# Patient Record
Sex: Female | Born: 1955 | Race: White | Hispanic: No | Marital: Married | Smoking: Former smoker
Health system: Southern US, Community
[De-identification: ages and names within clinical notes are randomized; demographics above are authoritative.]

## PROBLEM LIST (undated history)

## (undated) DIAGNOSIS — K219 Gastro-esophageal reflux disease without esophagitis: Secondary | ICD-10-CM

## (undated) DIAGNOSIS — E785 Hyperlipidemia, unspecified: Secondary | ICD-10-CM

## (undated) DIAGNOSIS — I10 Essential (primary) hypertension: Secondary | ICD-10-CM

## (undated) DIAGNOSIS — M1990 Unspecified osteoarthritis, unspecified site: Secondary | ICD-10-CM

## (undated) DIAGNOSIS — E039 Hypothyroidism, unspecified: Secondary | ICD-10-CM

## (undated) DIAGNOSIS — E119 Type 2 diabetes mellitus without complications: Secondary | ICD-10-CM

## (undated) DIAGNOSIS — I2510 Atherosclerotic heart disease of native coronary artery without angina pectoris: Secondary | ICD-10-CM

## (undated) HISTORY — DX: Essential (primary) hypertension: I10

## (undated) HISTORY — DX: Type 2 diabetes mellitus without complications: E11.9

## (undated) HISTORY — DX: Atherosclerotic heart disease of native coronary artery without angina pectoris: I25.10

## (undated) HISTORY — PX: APPENDECTOMY: SHX54

## (undated) HISTORY — DX: Hypothyroidism, unspecified: E03.9

## (undated) HISTORY — DX: Hyperlipidemia, unspecified: E78.5

## (undated) HISTORY — PX: ORIF ANKLE FRACTURE: SUR919

---

## 1980-11-02 HISTORY — PX: CHOLECYSTECTOMY: SHX55

## 1982-11-02 HISTORY — PX: TUBAL LIGATION: SHX77

## 2000-08-02 HISTORY — PX: CORONARY ANGIOPLASTY WITH STENT PLACEMENT: SHX49

## 2000-12-14 HISTORY — PX: CORONARY ARTERY BYPASS GRAFT: SHX141

## 2008-11-02 HISTORY — PX: FEMORAL ARTERY REPAIR: SHX1582

## 2009-06-02 HISTORY — PX: CORONARY ANGIOPLASTY WITH STENT PLACEMENT: SHX49

## 2013-08-02 DIAGNOSIS — E119 Type 2 diabetes mellitus without complications: Secondary | ICD-10-CM

## 2013-08-02 HISTORY — DX: Type 2 diabetes mellitus without complications: E11.9

## 2013-09-08 ENCOUNTER — Encounter: Payer: Self-pay | Admitting: Internal Medicine

## 2013-09-08 ENCOUNTER — Ambulatory Visit (INDEPENDENT_AMBULATORY_CARE_PROVIDER_SITE_OTHER): Payer: Managed Care, Other (non HMO) | Admitting: Internal Medicine

## 2013-09-08 VITALS — BP 110/80 | HR 69 | Ht 60.0 in | Wt 159.0 lb

## 2013-09-08 DIAGNOSIS — Z951 Presence of aortocoronary bypass graft: Secondary | ICD-10-CM

## 2013-09-08 DIAGNOSIS — I10 Essential (primary) hypertension: Secondary | ICD-10-CM

## 2013-09-08 DIAGNOSIS — I2510 Atherosclerotic heart disease of native coronary artery without angina pectoris: Secondary | ICD-10-CM

## 2013-09-08 DIAGNOSIS — E119 Type 2 diabetes mellitus without complications: Secondary | ICD-10-CM | POA: Insufficient documentation

## 2013-09-08 DIAGNOSIS — E785 Hyperlipidemia, unspecified: Secondary | ICD-10-CM | POA: Insufficient documentation

## 2013-09-08 DIAGNOSIS — E039 Hypothyroidism, unspecified: Secondary | ICD-10-CM

## 2013-09-08 MED ORDER — NITROGLYCERIN 0.4 MG SL SUBL: 0.4000 mg | SUBLINGUAL_TABLET | SUBLINGUAL | Status: DC | PRN

## 2013-09-08 NOTE — Progress Notes (Signed)
OFFICE NOTE  Chief Complaint:  Establish with a cardiologist  Primary Care Physician: Pcp Not In System  HPI:  Cheryl Dunn is a pleasant 57 year old female who is from [place], [place], who unfortunately has a history of coronary artery disease and underwent a stent to the proximal LAD in 2001. Apparently this was difficult and the result was somewhat suboptimal. Less than one year later she was having chest pain while in cardiac rehabilitation and underwent a repeat cardiac catheterization. This demonstrated a significant bifurcation lesion at the ostial LAD and left circumflex. Based on these findings a coronary bypass was recommended. She'll LAF underwent two-vessel coronary artery bypass grafting in 2002 with a LIMA to the LAD and radial artery to the circumflex.  She has done well since that time.  Cheryl Dunn has dyslipidemia, mild hypothyroidism and mild hypertension-well controlled.  And she doesn't and she unfortunately had a complication with cardiac catheterization and had to have a patch repair of the right femoral artery. Currently she is asymptomatic and is interesting in starting care with a cardiologist in the area.  PMHx:  Past Medical History  Diagnosis Date  . Diabetes 08/2013  . Hyperlipidemia   . Hypertension   . Hypothyroidism   . CAD (coronary artery disease)     s/p CABG & caths with stenting     Past Surgical History  Procedure Laterality Date  . Cholecystectomy  1982  . Appendectomy    . Tubal ligation  1984  . Coronary artery bypass graft  12/14/2000  . Coronary angioplasty with stent placement  08/2000    stent  . Coronary angioplasty with stent placement  06/2009    stent  . Femoral artery repair  2010    post-cath    FAMHx:  Family History  Problem Relation Age of Onset  . Hypertension Mother   . Diabetes Mother   . Hypertension Father   . Diabetes Father   . Heart attack Maternal Grandfather   . Hypertension Paternal Grandmother   . Liver disease  Paternal Grandfather   . Hyperlipidemia Sister     SOCHx:   reports that she has been smoking.  She has never used smokeless tobacco. She reports that she does not drink alcohol or use illicit drugs.  ALLERGIES:  Allergies  Allergen Reactions  . Codeine Diarrhea and Nausea Only  . Vicodin [Hydrocodone-Acetaminophen] Hives    ROS: A comprehensive review of systems was negative.  HOME MEDS: Current Outpatient Prescriptions  Medication Sig Dispense Refill  . clopidogrel (PLAVIX) 75 MG tablet Take 75 mg by mouth daily with breakfast.      . FLUTICASONE PROPIONATE, NASAL, NA Place into the nose daily as needed.      Marland Kitchen glipiZIDE (GLUCOTROL) 5 MG tablet Take 5 mg by mouth daily before breakfast.      . hydrochlorothiazide (HYDRODIURIL) 25 MG tablet Take 25 mg by mouth daily.      . isosorbide mononitrate (IMDUR) 30 MG 24 hr tablet Take 30 mg by mouth daily.      Marland Kitchen levothyroxine (SYNTHROID, LEVOTHROID) 150 MCG tablet Take 150 mcg by mouth daily before breakfast.      . metoprolol tartrate (LOPRESSOR) 25 MG tablet Take 25 mg by mouth 2 (two) times daily.      . nitroGLYCERIN (NITROSTAT) 0.4 MG SL tablet Place 1 tablet (0.4 mg total) under the tongue every 5 (five) minutes as needed for chest pain.  25 tablet  3  . OVER THE COUNTER  MEDICATION daily as needed (sleeping aid). Midnight - contains melatonin      . rosuvastatin (CRESTOR) 10 MG tablet Take 10 mg by mouth daily.       No current facility-administered medications for this visit.    LABS/IMAGING: No results found for this or any previous visit (from the past 48 hour(s)). No results found.  VITALS: Pulse 69  Ht 5' (1.524 m)  Wt 159 lb (72.122 kg)  BMI 31.05 kg/m2  EXAM: General appearance: alert and no distress Neck: no carotid bruit and no JVD Lungs: clear to auscultation bilaterally Heart: regular rate and rhythm, S1, S2 normal, no murmur, click, rub or gallop Abdomen: soft, non-tender; bowel sounds normal; no masses,   no organomegaly Extremities: extremities normal, atraumatic, no cyanosis or edema Pulses: 2+ and symmetric Skin: Skin color, texture, turgor normal. No rashes or lesions Neurologic: Grossly normal Psych: Pleasant, normal affect  EKG: Normal sinus rhythm at 69  ASSESSMENT: 1. Coronary artery disease status post PCI to the LAD in 2001 2. Two-vessel CABG for bifurcation ostial LAD and circumflex disease in 2002 (LIMA to LAD, free radial to circumflex) 3. Dyslipidemia 4. Hypertension 5. Diabetes type 2 6. Hypothyroidism  PLAN: 1.   Ms. Lekas is doing very well now 12 years after her bypass surgery. Firstly she's had 2 arterial conduits which generally have a very long life span. She's also been on good cholesterol medication and his work on diet and exercise. She's had no further cardiac events since that time although underwent cardiac catheterization in 2010 which of course was complicated by injury to the right femoral artery necessitating a repair. I would like to go ahead and recheck a lipid profile as it's been some time since her primary care doctor has done this. She is also establishing with a new primary care provider in [place]. We will go ahead and request records from [place] although she did bring records of her bypass anatomy and a catheterization film image from prior to her bypass.  Plan to see her back in 6 months.  Chrystie Nose, MD, [facility] Attending Cardiologist [facility]  Dory Demont C 09/08/2013, 1:20 PM

## 2013-09-08 NOTE — Patient Instructions (Addendum)
Please have fasting blood work.   Your physician wants you to follow-up in: 6 months You will receive a reminder letter in the mail two months in advance. If you don't receive a letter, please call our office to schedule the follow-up appointment.  We have sent a prescription for nitroglycerin to your pharmacy

## 2013-10-13 ENCOUNTER — Ambulatory Visit: Payer: Managed Care, Other (non HMO) | Admitting: Gastroenterology

## 2013-10-20 LAB — NMR LIPOPROFILE WITH LIPIDS
Cholesterol, Total: 169 mg/dL (ref <200)
HDL Particle Number: 44.6 umol/L (ref >=30.5)
HDL Size: 10 nm (ref >=9.2)
HDL-C: 62 mg/dL (ref >=40)
LDL (calc): 92 mg/dL (ref <100)
LDL Particle Number: 821 nmol/L (ref <1000)
LDL Size: 20.5 nm — ABNORMAL LOW (ref >20.5)
LP-IR Score: 36 (ref <=45)
Large HDL-P: 11.3 umol/L (ref >=4.8)
Large VLDL-P: 3.6 nmol/L — ABNORMAL HIGH (ref <=2.7)
Small LDL Particle Number: 378 nmol/L (ref <=527)
Triglycerides: 75 mg/dL (ref <150)
VLDL Size: 49.8 nm — ABNORMAL HIGH (ref <=46.6)

## 2013-10-31 ENCOUNTER — Encounter: Payer: Self-pay | Admitting: *Deleted

## 2014-08-30 ENCOUNTER — Ambulatory Visit (INDEPENDENT_AMBULATORY_CARE_PROVIDER_SITE_OTHER): Payer: Managed Care, Other (non HMO) | Admitting: Otolaryngology

## 2014-08-30 DIAGNOSIS — H903 Sensorineural hearing loss, bilateral: Secondary | ICD-10-CM

## 2014-08-30 DIAGNOSIS — H6983 Other specified disorders of Eustachian tube, bilateral: Secondary | ICD-10-CM

## 2014-10-02 ENCOUNTER — Telehealth: Payer: Self-pay | Admitting: Internal Medicine

## 2014-10-02 NOTE — Telephone Encounter (Signed)
Close encounter 

## 2014-11-08 ENCOUNTER — Ambulatory Visit (INDEPENDENT_AMBULATORY_CARE_PROVIDER_SITE_OTHER): Payer: Managed Care, Other (non HMO) | Admitting: Internal Medicine

## 2014-11-08 ENCOUNTER — Encounter: Payer: Self-pay | Admitting: Internal Medicine

## 2014-11-08 VITALS — BP 122/78 | HR 68 | Ht 60.0 in | Wt 167.4 lb

## 2014-11-08 DIAGNOSIS — I2510 Atherosclerotic heart disease of native coronary artery without angina pectoris: Secondary | ICD-10-CM

## 2014-11-08 DIAGNOSIS — E785 Hyperlipidemia, unspecified: Secondary | ICD-10-CM

## 2014-11-08 DIAGNOSIS — Z951 Presence of aortocoronary bypass graft: Secondary | ICD-10-CM

## 2014-11-08 DIAGNOSIS — I2583 Coronary atherosclerosis due to lipid rich plaque: Secondary | ICD-10-CM

## 2014-11-08 DIAGNOSIS — E119 Type 2 diabetes mellitus without complications: Secondary | ICD-10-CM

## 2014-11-08 DIAGNOSIS — I10 Essential (primary) hypertension: Secondary | ICD-10-CM

## 2014-11-08 NOTE — Patient Instructions (Signed)
Your physician wants you to follow-up in: 1 Year You will receive a reminder letter in the mail two months in advance. If you don't receive a letter, please call our office to schedule the follow-up appointment.  Your physician recommends that you return for lab work in: Fasting NMR with Lipids

## 2014-11-08 NOTE — Progress Notes (Signed)
OFFICE NOTE  Chief Complaint:  Routine follow-up, occasional chest twinge  Primary Care Physician: Cheryl Pizza, MD  HPI:  Cheryl Dunn is a pleasant 59 year old female who is from [place], [place], who unfortunately has a history of coronary artery disease and underwent a stent to the proximal LAD in 2001. Apparently this was difficult and the result was somewhat suboptimal. Less than one year later she was having chest pain while in cardiac rehabilitation and underwent a repeat cardiac catheterization. This demonstrated a significant bifurcation lesion at the ostial LAD and left circumflex. Based on these findings a coronary bypass was recommended. She'll LAF underwent two-vessel coronary artery bypass grafting in 2002 with a LIMA to the LAD and radial artery to the circumflex.  She has done well since that time.  Cheryl Dunn has dyslipidemia, mild hypothyroidism and mild hypertension-well controlled.  And she doesn't and she unfortunately had a complication with cardiac catheterization and had to have a patch repair of the right femoral artery. Currently she is asymptomatic and is interesting in starting care with a cardiologist in the area.  Cheryl Dunn returns today for follow-up. She feels quite well. She occasionally struggles with some sadness and has been intermittently smoking. She had quit successfully years ago around the time of her bypass surgery, but admits that she has been buying cigarettes and smoking. She has no real anginal symptoms and denies any worsening shortness of breath. She occasionally gets a cough which I feel is related to smoking.  PMHx:  Past Medical History  Diagnosis Date  . Diabetes 08/2013  . Hyperlipidemia   . Hypertension   . Hypothyroidism   . CAD (coronary artery disease)     s/p CABG & caths with stenting     Past Surgical History  Procedure Laterality Date  . Cholecystectomy  1982  . Appendectomy    . Tubal ligation  1984  . Coronary artery bypass graft   12/14/2000  . Coronary angioplasty with stent placement  08/2000    stent  . Coronary angioplasty with stent placement  06/2009    stent  . Femoral artery repair  2010    post-cath    FAMHx:  Family History  Problem Relation Age of Onset  . Hypertension Mother   . Diabetes Mother   . Hypertension Father   . Diabetes Father   . Heart attack Maternal Grandfather   . Hypertension Paternal Grandmother   . Liver disease Paternal Grandfather   . Hyperlipidemia Sister     SOCHx:   reports that she has been smoking.  She has never used smokeless tobacco. She reports that she does not drink alcohol or use illicit drugs.  ALLERGIES:  Allergies  Allergen Reactions  . Codeine Diarrhea and Nausea Only  . Vicodin [Hydrocodone-Acetaminophen] Hives    ROS: A comprehensive review of systems was negative except for: Respiratory: positive for cough  HOME MEDS: Current Outpatient Prescriptions  Medication Sig Dispense Refill  . clopidogrel (PLAVIX) 75 MG tablet Take 75 mg by mouth daily with breakfast.    . FLUTICASONE PROPIONATE, NASAL, NA Place into the nose daily as needed.    Marland Kitchen glipiZIDE (GLUCOTROL) 5 MG tablet Take 5 mg by mouth daily before breakfast.    . hydrochlorothiazide (HYDRODIURIL) 25 MG tablet Take 25 mg by mouth daily.    . isosorbide mononitrate (IMDUR) 30 MG 24 hr tablet Take 30 mg by mouth daily.    Marland Kitchen JANUVIA 100 MG tablet Take 1 tablet by mouth  daily.    . levothyroxine (SYNTHROID, LEVOTHROID) 150 MCG tablet Take 150 mcg by mouth daily before breakfast.    . metoprolol tartrate (LOPRESSOR) 25 MG tablet Take 25 mg by mouth 2 (two) times daily.    . nitroGLYCERIN (NITROSTAT) 0.4 MG SL tablet Place 1 tablet (0.4 mg total) under the tongue every 5 (five) minutes as needed for chest pain. 25 tablet 3  . OVER THE COUNTER MEDICATION daily as needed (sleeping aid). Midnight - contains melatonin    . ranitidine (ZANTAC) 300 MG capsule Take 1 capsule by mouth daily.    .  rosuvastatin (CRESTOR) 10 MG tablet Take 10 mg by mouth daily.     No current facility-administered medications for this visit.    LABS/IMAGING: No results found for this or any previous visit (from the past 48 hour(s)). No results found.  VITALS: BP 122/78 mmHg  Pulse 68  Ht 5' (1.524 m)  Wt 167 lb 6.4 oz (75.932 kg)  BMI 32.69 kg/m2  EXAM: General appearance: alert and no distress Neck: no carotid bruit and no JVD Lungs: clear to auscultation bilaterally Heart: regular rate and rhythm, S1, S2 normal, no murmur, click, rub or gallop Abdomen: soft, non-tender; bowel sounds normal; no masses,  no organomegaly Extremities: extremities normal, atraumatic, no cyanosis or edema Pulses: 2+ and symmetric Skin: Skin color, texture, turgor normal. No rashes or lesions Neurologic: Grossly normal Psych: Pleasant, normal affect  EKG: Normal sinus rhythm at 68  ASSESSMENT: 1. Coronary artery disease status post PCI to the LAD in 2001 2. Two-vessel CABG for bifurcation ostial LAD and circumflex disease in 2002 (LIMA to LAD, free radial to circumflex) 3. Dyslipidemia 4. Hypertension 5. Diabetes type 2 6. Hypothyroidism 7. Intermittent tobacco use  PLAN: 1.   Cheryl Dunn is doing very well now 12 years after her bypass surgery. She had a well controlled lipid profile one year ago. She is due for reassessment. Blood pressure is at goal. She needs to continue to work on exercise and weight loss. Her diabetes is being managed by primary care provider. She has reported intermittent tobacco use. It's unclear why she started to smoke again recently however it may be related to some depression. If she has difficulty stopping smoking she might be a candidate for Wellbutrin. I asked her to contact me if she is interested in starting this medication. We'll go ahead and recheck her lipids and plan to see her back annually or sooner as necessary.  Chrystie NoseKenneth C. Hilty, MD, [facility] Attending Cardiologist [facility]  HILTY,Kenneth C 11/08/2014, 9:39 AM

## 2014-11-19 ENCOUNTER — Telehealth: Payer: Self-pay | Admitting: Internal Medicine

## 2014-11-19 NOTE — Telephone Encounter (Signed)
Returned call to patient she stated she had lab work done at PCP.Stated she will have them fax Dr.Hilty the results.

## 2014-11-19 NOTE — Telephone Encounter (Signed)
Pt went to primary doctor on 11-16-14. He put her on additional medicine for diabetes and went on and filled her cholesterol medicine.She just wanted you to be aware of this.

## 2014-11-29 ENCOUNTER — Other Ambulatory Visit: Payer: Self-pay | Admitting: Internal Medicine

## 2014-11-29 NOTE — Telephone Encounter (Signed)
Rx has been sent to the pharmacy electronically. ° °

## 2015-11-28 ENCOUNTER — Encounter: Payer: Self-pay | Admitting: Internal Medicine

## 2015-11-28 ENCOUNTER — Ambulatory Visit (INDEPENDENT_AMBULATORY_CARE_PROVIDER_SITE_OTHER): Payer: Managed Care, Other (non HMO) | Admitting: Internal Medicine

## 2015-11-28 VITALS — BP 110/78 | HR 72 | Ht 60.0 in | Wt 163.6 lb

## 2015-11-28 DIAGNOSIS — I10 Essential (primary) hypertension: Secondary | ICD-10-CM | POA: Diagnosis not present

## 2015-11-28 DIAGNOSIS — Z951 Presence of aortocoronary bypass graft: Secondary | ICD-10-CM | POA: Diagnosis not present

## 2015-11-28 DIAGNOSIS — E785 Hyperlipidemia, unspecified: Secondary | ICD-10-CM | POA: Diagnosis not present

## 2015-11-28 DIAGNOSIS — I2510 Atherosclerotic heart disease of native coronary artery without angina pectoris: Secondary | ICD-10-CM

## 2015-11-28 MED ORDER — NITROGLYCERIN 0.4 MG SL SUBL: 0.4000 mg | SUBLINGUAL_TABLET | SUBLINGUAL | Status: DC | PRN

## 2015-11-28 NOTE — Patient Instructions (Signed)
Dr Hilty has made no changes in your current medications or treatment plan.  Your physician recommends that you schedule a follow-up appointment in 1 year. You will receive a reminder letter in the mail two months in advance. If you don't receive a letter, please call our office to schedule the follow-up appointment.  If you need a refill on your cardiac medications before your next appointment, please call your pharmacy. 

## 2015-11-28 NOTE — Progress Notes (Signed)
OFFICE NOTE  Chief Complaint:  Routine follow-up, quit smoking in the Fall  Primary Care Physician: Dwana Melena, MD  HPI:  Cheryl Dunn is a pleasant 60 year old female who is from [place], [place], who unfortunately has a history of coronary artery disease and underwent a stent to the proximal LAD in 2001. Apparently this was difficult and the result was somewhat suboptimal. Less than one year later she was having chest pain while in cardiac rehabilitation and underwent a repeat cardiac catheterization. This demonstrated a significant bifurcation lesion at the ostial LAD and left circumflex. Based on these findings a coronary bypass was recommended. She'll LAF underwent two-vessel coronary artery bypass grafting in 2002 with a LIMA to the LAD and radial artery to the circumflex.  She has done well since that time.  Cheryl Dunn has dyslipidemia, mild hypothyroidism and mild hypertension-well controlled.  And she doesn't and she unfortunately had a complication with cardiac catheterization and had to have a patch repair of the right femoral artery. Currently she is asymptomatic and is interesting in starting care with a cardiologist in the area.  [name] returns today for follow-up. She feels quite well. She occasionally struggles with some sadness and has been intermittently smoking. She had quit successfully years ago around the time of her bypass surgery, but admits that she has been buying cigarettes and smoking. She has no real anginal symptoms and denies any worsening shortness of breath. She occasionally gets a cough which I feel is related to smoking.  I had the pleasure of seeing [name] back today in the office. Overall she is doing really well. She fortunately quit smoking this past fall and has been abstinent from cigarettes since then. She denies any chest pain or worsening shortness of breath. She has had some weight gain which is not unexpected with smoking cessation. She reported she had  recent cholesterol work done by primary care provider which is mildly elevated. She is currently only taking Crestor 5 mg daily instead of 10 mg due to some constipation. We may need to readjust this dosing based on her lab work which I will obtain. She has not needed to use any nitroglycerin.  PMHx:  Past Medical History  Diagnosis Date  . Diabetes (HCC) 08/2013  . Hyperlipidemia   . Hypertension   . Hypothyroidism   . CAD (coronary artery disease)     s/p CABG & caths with stenting     Past Surgical History  Procedure Laterality Date  . Cholecystectomy  1982  . Appendectomy    . Tubal ligation  1984  . Coronary artery bypass graft  12/14/2000  . Coronary angioplasty with stent placement  08/2000    stent  . Coronary angioplasty with stent placement  06/2009    stent  . Femoral artery repair  2010    post-cath    FAMHx:  Family History  Problem Relation Age of Onset  . Hypertension Mother   . Diabetes Mother   . Hypertension Father   . Diabetes Father   . Heart attack Maternal Grandfather   . Hypertension Paternal Grandmother   . Liver disease Paternal Grandfather   . Hyperlipidemia Sister     SOCHx:   reports that she has quit smoking. She has never used smokeless tobacco. She reports that she does not drink alcohol or use illicit drugs.  ALLERGIES:  Allergies  Allergen Reactions  . Codeine Diarrhea and Nausea Only  . Vicodin [Hydrocodone-Acetaminophen] Hives    ROS:  Pertinent items noted in HPI and remainder of comprehensive ROS otherwise negative.  HOME MEDS: Current Outpatient Prescriptions  Medication Sig Dispense Refill  . celecoxib (CELEBREX) 200 MG capsule Take 200 mg by mouth 2 (two) times daily.    . clopidogrel (PLAVIX) 75 MG tablet Take 75 mg by mouth daily with breakfast.    . FLUTICASONE PROPIONATE, NASAL, NA Place into the Dunn daily as needed.    Marland Kitchen glipiZIDE (GLUCOTROL) 5 MG tablet Take 5 mg by mouth daily before breakfast.    .  hydrochlorothiazide (HYDRODIURIL) 25 MG tablet Take 25 mg by mouth daily.    . isosorbide mononitrate (IMDUR) 30 MG 24 hr tablet Take 30 mg by mouth daily.    Marland Kitchen levothyroxine (SYNTHROID, LEVOTHROID) 125 MCG tablet Take 125 mcg by mouth daily before breakfast.    . metoprolol tartrate (LOPRESSOR) 25 MG tablet Take 25 mg by mouth 2 (two) times daily.    . nitroGLYCERIN (NITROSTAT) 0.4 MG SL tablet Place 1 tablet (0.4 mg total) under the tongue every 5 (five) minutes as needed for chest pain. 25 tablet 3  . OVER THE COUNTER MEDICATION daily as needed (sleeping aid). Midnight - contains melatonin    . ranitidine (ZANTAC) 300 MG capsule Take 1 capsule by mouth daily.    . rosuvastatin (CRESTOR) 10 MG tablet Take 10 mg by mouth daily.     No current facility-administered medications for this visit.    LABS/IMAGING: No results found for this or any previous visit (from the past 48 hour(s)). No results found.  VITALS: BP 110/78 mmHg  Pulse 72  Ht 5' (1.524 m)  Wt 163 lb 9 oz (74.191 kg)  BMI 31.94 kg/m2  EXAM: General appearance: alert and no distress Neck: no carotid bruit and no JVD Lungs: clear to auscultation bilaterally Heart: regular rate and rhythm, S1, S2 normal, no murmur, click, rub or gallop Abdomen: soft, non-tender; bowel sounds normal; no masses,  no organomegaly Extremities: extremities normal, atraumatic, no cyanosis or edema Pulses: 2+ and symmetric Skin: Skin color, texture, turgor normal. No rashes or lesions Neurologic: Grossly normal Psych: Pleasant, normal affect  EKG: Normal sinus rhythm at 72   ASSESSMENT: 1. Coronary artery disease status post PCI to the LAD in 2001 2. Two-vessel CABG for bifurcation ostial LAD and circumflex disease in 2002 (LIMA to LAD, free radial to circumflex) 3. Dyslipidemia 4. Hypertension 5. Diabetes type 2 6. Hypothyroidism 7. Quit smoking fall 2016  PLAN: 1.   [name] is doing very well. She denies any chest pain or  worsening shortness of breath. Fortunately she stop smoking which I commended her on. She is currently taking daily Plavix but not aspirin. She feels more comfortable and Plavix than aspirin which is fine. She is only taking half dose of the Crestor and cholesterol was previously well controlled. She attributes the redo reduction in dose to constipation. I like to review her numbers and may make an argument to go back up on the dose and encourage things like stool softeners, prune juice or other things to help with constipation. She has a new medicine for osteoarthritis which is Celebrex. We talked about the possible risks of nonsteroidals and coronary disease. She does say that she takes the medicine very infrequently but it is helpful. I'll go ahead and refill her nitroglycerin when necessary today in the office. Plan to see her back annually or sooner as necessary.   Cheryl Nose, MD, [facility] Attending Cardiologist [facility]  Cheryl Dunn 11/28/2015, 8:24 AM

## 2016-10-09 ENCOUNTER — Ambulatory Visit: Payer: Managed Care, Other (non HMO) | Admitting: Internal Medicine

## 2016-10-13 ENCOUNTER — Ambulatory Visit: Payer: Managed Care, Other (non HMO) | Admitting: Internal Medicine

## 2016-11-09 ENCOUNTER — Ambulatory Visit (INDEPENDENT_AMBULATORY_CARE_PROVIDER_SITE_OTHER): Payer: Managed Care, Other (non HMO) | Admitting: Internal Medicine

## 2016-11-09 ENCOUNTER — Encounter: Payer: Self-pay | Admitting: Internal Medicine

## 2016-11-09 VITALS — BP 102/68 | HR 84 | Ht 60.0 in | Wt 157.4 lb

## 2016-11-09 DIAGNOSIS — M79604 Pain in right leg: Secondary | ICD-10-CM | POA: Diagnosis not present

## 2016-11-09 DIAGNOSIS — I10 Essential (primary) hypertension: Secondary | ICD-10-CM | POA: Diagnosis not present

## 2016-11-09 DIAGNOSIS — Z951 Presence of aortocoronary bypass graft: Secondary | ICD-10-CM

## 2016-11-09 DIAGNOSIS — I2510 Atherosclerotic heart disease of native coronary artery without angina pectoris: Secondary | ICD-10-CM

## 2016-11-09 DIAGNOSIS — E785 Hyperlipidemia, unspecified: Secondary | ICD-10-CM | POA: Diagnosis not present

## 2016-11-09 NOTE — Patient Instructions (Addendum)
Dr. Rennis GoldenHilty advises that you take CRESTOR 10mg  EVERY DAY -- call our office and let us know how you are doing after two weeks -- if you tolerate 10mg , Dr. Rennis GoldenHilty would advise increasing your dose to 20mg  daily to better manage cholesterol  Your physician has requested that you have a lower extremity arterial duplex. This test is an ultrasound of the arteries in the legs. It looks at arterial blood flow in the legs. Allow one hour for Lower Arterial scans. There are no restrictions or special instructions  Your physician wants you to follow-up in: ONE YEAR with Dr. Rennis GoldenHilty. You will receive a reminder letter in the mail two months in advance. If you don't receive a letter, please call our office to schedule the follow-up appointment.

## 2016-11-09 NOTE — Progress Notes (Signed)
OFFICE NOTE  Chief Complaint:  No complaints  Primary Care Physician: Dwana MelenaZack Hall, MD  HPI:  Cheryl Dunn is a pleasant 61 year old female who [place], who unfortunately has a history of coronary artery disease and underwent a stent to the proximal LAD in 2001. Apparently this was difficult and the result was somewhat suboptimal. Less than one year later she was having chest pain while in cardiac rehabilitation and underwent a repeat cardiac catheterization. This demonstrated a significant bifurcation lesion at the ostial LAD and left circumflex. Based on these findings a coronary bypass was recommended. She'll LAF underwent two-vessel coronary artery bypass grafting in 2002 with a LIMA to the LAD and radial artery to the circumflex.  She has done well since that time.  Cheryl Dunn has dyslipidemia, mild hypothyroidism and mild hypertension-well controlled.  And she doesn't and she unfortunately had a complication with cardiac catheterization and had to have a patch repair of the right femoral artery. Currently she is asymptomatic and is interesting in starting care with a cardiologist in the area.  Cheryl Dunn returns today for follow-up. She feels quite well. She occasionally struggles with some sadness and has been intermittently smoking. She had quit successfully years ago around the time of her bypass surgery, but admits that she has been buying cigarettes and smoking. She has no real anginal symptoms and denies any worsening shortness of breath. She occasionally gets a cough which I feel is related to smoking.  I had the pleasure of seeing Cheryl Dunn back today in the office. Overall she is doing really well. She fortunately quit smoking this past fall and has been abstinent from cigarettes since then. She denies any chest pain or worsening shortness of breath. She has had some weight gain which is not unexpected with smoking cessation. She reported she had recent cholesterol work done by  primary care provider which is mildly elevated. She is currently only taking Crestor 5 mg daily instead of 10 mg due to some constipation. We may need to readjust this dosing based on her lab work which I will obtain. She has not needed to use any nitroglycerin.  11/09/2016  Cheryl Dunn returns today for follow-up. She has no complaints of last year although has been under significant stress. She's been traveling back and forth to [place] to care for her mother. Her weight is actually down from 163-157 pounds. She is more rarely taking Celebrex, only as needed. Recent lab work from her PCP the end of December indicated total cholesterol 219, HDL 71, triglycerides 129 and LDL 124. This is a marked increase from her last numbers. She reports that she is only taking half of her rosuvastatin (5 mg). She thought that the higher dose gave her "gas". She also is complaining of some right thigh discomfort. It seems to be more depressed when laying down and not necessarily with walking. She wondered if it was related to her prior patch angioplasty of her right femoral artery. I do not suspect she has any significant disease however it would be reasonable to reassess that with lower extremity arterial Dopplers.  PMHx:  Past Medical History:  Diagnosis Date  . CAD (coronary artery disease)    s/p CABG & caths with stenting   . Diabetes (HCC) 08/2013  . Hyperlipidemia   . Hypertension   . Hypothyroidism     Past Surgical History:  Procedure Laterality Date  . APPENDECTOMY    . CHOLECYSTECTOMY  1982  . CORONARY  ANGIOPLASTY WITH STENT PLACEMENT  08/2000   stent  . CORONARY ANGIOPLASTY WITH STENT PLACEMENT  06/2009   stent  . CORONARY ARTERY BYPASS GRAFT  12/14/2000  . FEMORAL ARTERY REPAIR  2010   post-cath  . TUBAL LIGATION  1984    FAMHx:  Family History  Problem Relation Age of Onset  . Hypertension Mother   . Diabetes Mother   . Hypertension Father   . Diabetes Father   . Heart attack Maternal  Grandfather   . Hypertension Paternal Grandmother   . Liver disease Paternal Grandfather   . Hyperlipidemia Sister     SOCHx:   reports that she has quit smoking. She quit after 20.00 years of use. She has never used smokeless tobacco. She reports that she does not drink alcohol or use drugs.  ALLERGIES:  Allergies  Allergen Reactions  . Amoxicillin Itching  . Codeine Diarrhea and Nausea Only  . Vicodin [Hydrocodone-Acetaminophen] Hives    ROS: Pertinent items noted in HPI and remainder of comprehensive ROS otherwise negative.  HOME MEDS: Current Outpatient Prescriptions  Medication Sig Dispense Refill  . celecoxib (CELEBREX) 200 MG capsule Take 200 mg by mouth 2 (two) times daily.    . clopidogrel (PLAVIX) 75 MG tablet Take 75 mg by mouth daily with breakfast.    . FLUTICASONE PROPIONATE, NASAL, NA Place into the nose daily as needed.    Marland Kitchen glipiZIDE (GLUCOTROL) 5 MG tablet Take 5 mg by mouth daily before breakfast.    . hydrochlorothiazide (HYDRODIURIL) 25 MG tablet Take 25 mg by mouth as needed.    . isosorbide mononitrate (IMDUR) 30 MG 24 hr tablet Take 30 mg by mouth daily.    . metoprolol tartrate (LOPRESSOR) 25 MG tablet Take 25 mg by mouth 2 (two) times daily.    . nitroGLYCERIN (NITROSTAT) 0.4 MG SL tablet Place 1 tablet (0.4 mg total) under the tongue every 5 (five) minutes as needed for chest pain. 25 tablet 3  . OVER THE COUNTER MEDICATION daily as needed (sleeping aid). Midnight - contains melatonin    . ranitidine (ZANTAC) 300 MG capsule Take 1 capsule by mouth daily.    . rosuvastatin (CRESTOR) 10 MG tablet Take 10 mg by mouth daily.    Marland Kitchen SYNTHROID 150 MCG tablet Take 1 tablet by mouth daily.     No current facility-administered medications for this visit.     LABS/IMAGING: No results found for this or any previous visit (from the past 48 hour(s)). No results found.  VITALS: BP 102/68 (BP Location: Left Arm, Patient Position: Sitting, Cuff Size: Normal)    Pulse 84   Ht 5' (1.524 m)   Wt 157 lb 6.4 oz (71.4 kg)   BMI 30.74 kg/m   EXAM: General appearance: alert and no distress Neck: no carotid bruit and no JVD Lungs: clear to auscultation bilaterally Heart: regular rate and rhythm, S1, S2 normal, no murmur, click, rub or gallop Abdomen: soft, non-tender; bowel sounds normal; no masses,  no organomegaly Extremities: extremities normal, atraumatic, no cyanosis or edema Pulses: 2+ and symmetric Skin: Skin color, texture, turgor normal. No rashes or lesions Neurologic: Grossly normal Psych: Pleasant, normal affect  EKG: Normal sinus rhythm at 84  ASSESSMENT: 1. Coronary artery disease status post PCI to the LAD in 2001 2. Two-vessel CABG for bifurcation ostial LAD and circumflex disease in 2002 (LIMA to LAD, free radial to circumflex) 3. Patch angioplasty to the right common femoral artery for iatrogenic injury 4. Dyslipidemia 5.  Hypertension 6. Diabetes type 2 7. Hypothyroidism 8. Quit smoking fall 2016  PLAN: 1.   Cheryl Dunn is doing very well. She denies any chest pain or worsening shortness of breath. She is having some right thigh discomfort and we'll go ahead and check lower chimney arterial Dopplers as she's had prior vascular repair of her common femoral artery. Cholesterol is elevated and she reports that she's been taking less medication than she previously had. I do not feel that the medicine would cause her to have gas. I advised her to increase her rosuvastatin back to 10 mg daily and if tolerated after 2 weeks she should increase it up to 20 mg daily. We need to get her LDL-C down to below 70 mg/dL. Plan to reassess lipid profile in 3 months. Follow-up otherwise in one year.  Chrystie Nose, MD, [facility] Attending Cardiologist [facility]  Chrystie Nose 11/09/2016, 9:33 AM

## 2016-11-24 ENCOUNTER — Other Ambulatory Visit: Payer: Self-pay | Admitting: Internal Medicine

## 2016-11-24 DIAGNOSIS — I739 Peripheral vascular disease, unspecified: Secondary | ICD-10-CM

## 2016-11-24 DIAGNOSIS — M79604 Pain in right leg: Secondary | ICD-10-CM

## 2016-12-02 ENCOUNTER — Other Ambulatory Visit: Payer: Self-pay | Admitting: Internal Medicine

## 2016-12-02 ENCOUNTER — Ambulatory Visit (HOSPITAL_COMMUNITY)
Admission: RE | Admit: 2016-12-02 | Discharge: 2016-12-02 | Disposition: A | Discharge status: Home / Self Care | Payer: Managed Care, Other (non HMO) | Source: Ambulatory Visit | Attending: Cardiovascular Disease | Admitting: Cardiovascular Disease

## 2016-12-02 DIAGNOSIS — I739 Peripheral vascular disease, unspecified: Secondary | ICD-10-CM | POA: Insufficient documentation

## 2016-12-02 DIAGNOSIS — E785 Hyperlipidemia, unspecified: Secondary | ICD-10-CM | POA: Diagnosis not present

## 2016-12-02 DIAGNOSIS — I2510 Atherosclerotic heart disease of native coronary artery without angina pectoris: Secondary | ICD-10-CM | POA: Diagnosis not present

## 2017-06-16 ENCOUNTER — Encounter (INDEPENDENT_AMBULATORY_CARE_PROVIDER_SITE_OTHER): Payer: Self-pay | Admitting: *Deleted

## 2017-09-08 ENCOUNTER — Other Ambulatory Visit (HOSPITAL_COMMUNITY): Payer: Self-pay | Admitting: Internal Medicine

## 2017-09-08 DIAGNOSIS — R1905 Periumbilic swelling, mass or lump: Secondary | ICD-10-CM

## 2017-09-14 ENCOUNTER — Ambulatory Visit (HOSPITAL_COMMUNITY)
Admission: RE | Admit: 2017-09-14 | Discharge: 2017-09-14 | Disposition: A | Discharge status: Home / Self Care | Payer: Managed Care, Other (non HMO) | Source: Ambulatory Visit | Attending: Internal Medicine | Admitting: Internal Medicine

## 2017-09-14 DIAGNOSIS — R1905 Periumbilic swelling, mass or lump: Secondary | ICD-10-CM | POA: Insufficient documentation

## 2017-09-14 DIAGNOSIS — Z9049 Acquired absence of other specified parts of digestive tract: Secondary | ICD-10-CM | POA: Diagnosis not present

## 2017-09-14 DIAGNOSIS — K760 Fatty (change of) liver, not elsewhere classified: Secondary | ICD-10-CM | POA: Diagnosis not present

## 2017-09-30 ENCOUNTER — Ambulatory Visit (INDEPENDENT_AMBULATORY_CARE_PROVIDER_SITE_OTHER): Payer: Managed Care, Other (non HMO) | Admitting: General Surgery

## 2017-09-30 ENCOUNTER — Encounter: Payer: Self-pay | Admitting: General Surgery

## 2017-09-30 VITALS — BP 147/79 | HR 91 | Temp 98.7°F | Resp 18 | Ht 60.0 in | Wt 159.0 lb

## 2017-09-30 DIAGNOSIS — K439 Ventral hernia without obstruction or gangrene: Secondary | ICD-10-CM | POA: Diagnosis not present

## 2017-09-30 NOTE — Progress Notes (Signed)
[facility] History and Physical  Reason for Referral: Hernia  Referring Physician: Dr. Margo AyeHall  Chief Complaint    Hernia      Cheryl Dunn is a 61 y.o. female.  HPI: Ms. Cheryl Dunn is a very pleasant 61 yo with CAD s/p stent and CABG 2002 in [place] who comes in with a few months report of a bulging area above her umbilicus. She says she noticed it first back in the summer, and that since that time whenever she does outside work such as shoveling or lifting that it sticks out more and becomes hard at times.  She denies any vomiting but when she has had it stuck out there has been some nausea. She has BMs irregularly and has to take dulcolax and has soft BMs every 3-4 days when she takes this medication.  She reports having a colonoscopy more than 10 years ago in [place], and is schedule for another in February.   She is active outside, digging holes and shoveling rocks.  She has had an open umbilical hernia repair and a tubal ligation through her umbilicus per her report. In addition to her CABG and a femoral artery repair after an injury following a cardiac catheterization.   Past Medical History:  Diagnosis Date  . CAD (coronary artery disease)    s/p CABG & caths with stenting   . Diabetes (HCC) 08/2013  . Hyperlipidemia   . Hypertension   . Hypothyroidism     Past Surgical History:  Procedure Laterality Date  . APPENDECTOMY    . CHOLECYSTECTOMY  1982  . CORONARY ANGIOPLASTY WITH STENT PLACEMENT  08/2000   stent  . CORONARY ANGIOPLASTY WITH STENT PLACEMENT  06/2009   stent  . CORONARY ARTERY BYPASS GRAFT  12/14/2000  . FEMORAL ARTERY REPAIR  2010   post-cath  . TUBAL LIGATION  1984    Family History  Problem Relation Age of Onset  . Hypertension Mother   . Diabetes Mother   . Hypertension Father   . Diabetes Father   . Heart attack Maternal Grandfather   . Hypertension Paternal Grandmother   . Liver disease Paternal Grandfather   . Hyperlipidemia Sister      Social History   Tobacco Use  . Smoking status: Former Smoker    Years: 20.00  . Smokeless tobacco: Never Used  . Tobacco comment: 'every now and then' 11/08/14  Substance Use Topics  . Alcohol use: No    Alcohol/week: 0.0 oz  . Drug use: No    Medications: I have reviewed the patient's current medications. Allergies as of 09/30/2017      Reactions   Amoxicillin Itching   Codeine Diarrhea, Nausea Only   Vicodin [hydrocodone-acetaminophen] Hives      Medication List        Accurate as of 09/30/17  3:42 PM. Always use your most recent med list.          celecoxib 200 MG capsule Commonly known as:  CELEBREX Take 200 mg by mouth 2 (two) times daily.   clopidogrel 75 MG tablet Commonly known as:  PLAVIX Take 75 mg by mouth daily with breakfast.   FLUTICASONE PROPIONATE (NASAL) NA Place into the nose daily as needed.   glipiZIDE 5 MG tablet Commonly known as:  GLUCOTROL Take 5 mg by mouth daily before breakfast.   hydrochlorothiazide 25 MG tablet Commonly known as:  HYDRODIURIL Take 25 mg by mouth as needed.   isosorbide mononitrate 30 MG 24 hr tablet  Commonly known as:  IMDUR Take 30 mg by mouth daily.   metoprolol tartrate 25 MG tablet Commonly known as:  LOPRESSOR Take 25 mg by mouth 2 (two) times daily.   nitroGLYCERIN 0.4 MG SL tablet Commonly known as:  NITROSTAT Place 1 tablet (0.4 mg total) under the tongue every 5 (five) minutes as needed for chest pain.   OVER THE COUNTER MEDICATION daily as needed (sleeping aid). Midnight - contains melatonin   ranitidine 300 MG capsule Commonly known as:  ZANTAC Take 1 capsule by mouth daily.   rosuvastatin 10 MG tablet Commonly known as:  CRESTOR Take 10 mg by mouth daily.   SYNTHROID 150 MCG tablet Generic drug:  levothyroxine Take 1 tablet by mouth daily.        ROS:  A comprehensive review of systems was negative except for: Constitutional: positive for chills Gastrointestinal: positive  for abdominal pain and reflux symptoms Genitourinary: positive for frequency Musculoskeletal: positive for back pain, bone pain and stiff joints Endocrine: positive for tired and sluggish  Blood pressure (!) 147/79, pulse 91, temperature 98.7 F (37.1 C), resp. rate 18, height 5' (1.524 m), weight 159 lb (72.1 kg). Physical Exam  Constitutional: She is oriented to person, place, and time and well-developed, well-nourished, and in no distress.  HENT:  Head: Normocephalic.  Eyes: Pupils are equal, round, and reactive to light.  Neck: Normal range of motion.  Cardiovascular: Normal rate and regular rhythm.  Pulmonary/Chest: Effort normal.  Abdominal: Soft. She exhibits no distension. There is tenderness.  Area of bugling fat supraumbilical about 2cm, nonreducible, difficult to appreciate fascial edges, some tenderness over the area  Musculoskeletal: Normal range of motion.  Neurological: She is alert and oriented to person, place, and time.  Skin: Skin is warm and dry.  Psychiatric: Mood, memory, affect and judgment normal.  Vitals reviewed.   Results: Personally reviewed US - hernia with fat and fascial defect likely 1.5cm  US 09/14/17 IMPRESSION: Probable ventral hernia containing fat in the supraumbilical region.  Prior cholecystectomy.  Fatty liver.  Assessment & Plan:  Cheryl Dunn is a 61 y.o. female with an epigastric hernia not related to any scars from prior surgeries. Based on the ultrasound and my exam likely just fat containing but could have additional contents at times with her description. This is causing pain and has been stuck out and hard per her report at times. The patient is active and works outside. She denies any chest pain or shortness of breath.  She reports seeing Dr. Ladon ApplebaumHility her Cardiologist 11/2016 and not since that time.   -Cardiac risk stratification to be performed  -Will hold on scheduling until we get clearance from Dr. Ladon ApplebaumHility and then will get  patient on the schedule for an open hernia repair with mesh  All questions were answered to the satisfaction of the patient and family.  The risk and benefits of open hernia repair with mesh were discussed including but not limited to bleeding, infection, injury to bowel, recurrence, and need for future surgeries.  After careful consideration, Cheryl Dunn has decided to proceed following her cardiology evaluation.    [name] 09/30/2017, 3:42 PM

## 2017-09-30 NOTE — Patient Instructions (Signed)
Cardiac risk stratification prior to scheduling surgery.   Open Hernia Repair, Adult Open hernia repair is a surgical procedure to fix a hernia. A hernia occurs when an internal organ or tissue pushes out through a weak spot in the abdominal wall muscles. Hernias commonly occur in the groin and around the navel. Most hernias tend to get worse over time. Often, surgery is done to prevent the hernia from becoming bigger, uncomfortable, or an emergency. Emergency surgery may be needed if abdominal contents get stuck in the opening (incarcerated hernia) or the blood supply gets cut off (strangulated hernia). In an open repair, an incision is made in the abdomen to perform the surgery. Tell a health care provider about:  Any allergies you have.  All medicines you are taking, including vitamins, herbs, eye drops, creams, and over-the-counter medicines.  Any problems you or family members have had with anesthetic medicines.  Any blood or bone disorders you have.  Any surgeries you have had.  Any medical conditions you have, including any recent cold or flu symptoms.  Whether you are pregnant or may be pregnant. What are the risks? Generally, this is a safe procedure. However, problems may occur, including:  Long-lasting (chronic) pain.  Bleeding.  Infection.  Damage to the testicle. This can cause shrinking or swelling.  Damage to the bladder, blood vessels, intestine, or nerves near the hernia.  Trouble passing urine.  Allergic reactions to medicines.  Return of the hernia.  What happens before the procedure? Staying hydrated Follow instructions from your health care provider about hydration, which may include:  Up to 2 hours before the procedure - you may continue to drink clear liquids, such as water, clear fruit juice, black coffee, and plain tea.  Eating and drinking restrictions Follow instructions from your health care provider about eating and drinking, which may  include:  8 hours before the procedure - stop eating heavy meals or foods such as meat, fried foods, or fatty foods.  6 hours before the procedure - stop eating light meals or foods, such as toast or cereal.  6 hours before the procedure - stop drinking milk or drinks that contain milk.  2 hours before the procedure - stop drinking clear liquids.  Medicines  Ask your health care provider about: ? Changing or stopping your regular medicines. This is especially important if you are taking diabetes medicines or blood thinners. ? Taking medicines such as aspirin and ibuprofen. These medicines can thin your blood. Do not take these medicines before your procedure if your health care provider instructs you not to.  You may be given antibiotic medicine to help prevent infection. General instructions  You may have blood tests or imaging studies.  Ask your health care provider how your surgical site will be marked or identified.  If you smoke, do not smoke for at least 2 weeks before your procedure or for as long as told by your health care provider.  Let your health care provider know if you develop a cold or any infection before your surgery.  Plan to have someone take you home from the hospital or clinic.  If you will be going home right after the procedure, plan to have someone with you for 24 hours. What happens during the procedure?  To reduce your risk of infection: ? Your health care team will wash or sanitize their hands. ? Your skin will be washed with soap. ? Hair may be removed from the surgical area.  An IV  tube will be inserted into one of your veins.  You will be given one or more of the following: ? A medicine to help you relax (sedative). ? A medicine to numb the area (local anesthetic). ? A medicine to make you fall asleep (general anesthetic).  Your surgeon will make an incision over the hernia.  The tissues of the hernia will be moved back into place.  The  edges of the hernia may be stitched together.  The opening in the abdominal muscles will be closed with stitches (sutures). Or, your surgeon will place a mesh patch made of manmade (synthetic) material over the opening.  The incision will be closed.  A bandage (dressing) may be placed over the incision. The procedure may vary among health care providers and hospitals. What happens after the procedure?  Your blood pressure, heart rate, breathing rate, and blood oxygen level will be monitored until the medicines you were given have worn off.  You may be given medicine for pain.  Do not drive for 24 hours if you received a sedative. This information is not intended to replace advice given to you by your health care provider. Make sure you discuss any questions you have with your health care provider. Document Released: 04/14/2001 Document Revised: 05/08/2016 Document Reviewed: 04/01/2016 Elsevier Interactive Patient Education  2018 [employer].   Epigastric/ Umbilical Hernia, Adult A hernia is a bulge of tissue that pushes through an opening between muscles. An umbilical hernia happens in the abdomen, near the belly button (umbilicus). The hernia may contain tissues from the small intestine, large intestine, or fatty tissue covering the intestines (omentum). Umbilical hernias in adults tend to get worse over time, and they require surgical treatment. There are several types of umbilical hernias. You may have:  A hernia located just above or below the umbilicus (indirect hernia). This is the most common type of umbilical hernia in adults.  A hernia that forms through an opening formed by the umbilicus (direct hernia).  A hernia that comes and goes (reducible hernia). A reducible hernia may be visible only when you strain, lift something heavy, or cough. This type of hernia can be pushed back into the abdomen (reduced).  A hernia that traps abdominal tissue inside the hernia (incarcerated  hernia). This type of hernia cannot be reduced.  A hernia that cuts off blood flow to the tissues inside the hernia (strangulated hernia). The tissues can start to die if this happens. This type of hernia requires emergency treatment.  What are the causes? An umbilical hernia happens when tissue inside the abdomen presses on a weak area of the abdominal muscles. What increases the risk? You may have a greater risk of this condition if you:  Are obese.  Have had several pregnancies.  Have a buildup of fluid inside your abdomen (ascites).  Have had surgery that weakens the abdominal muscles.  What are the signs or symptoms? The main symptom of this condition is a painless bulge at or near the belly button. A reducible hernia may be visible only when you strain, lift something heavy, or cough. Other symptoms may include:  Dull pain.  A feeling of pressure.  Symptoms of a strangulated hernia may include:  Pain that gets increasingly worse.  Nausea and vomiting.  Pain when pressing on the hernia.  Skin over the hernia becoming red or purple.  Constipation.  Blood in the stool.  How is this diagnosed? This condition may be diagnosed based on:  A  physical exam. You may be asked to cough or strain while standing. These actions increase the pressure inside your abdomen and force the hernia through the opening in your muscles. Your health care provider may try to reduce the hernia by pressing on it.  Your symptoms and medical history.  How is this treated? Surgery is the only treatment for an umbilical hernia. Surgery for a strangulated hernia is done as soon as possible. If you have a small hernia that is not incarcerated, you may need to lose weight before having surgery. Follow these instructions at home:  Lose weight, if told by your health care provider.  Do not try to push the hernia back in.  Watch your hernia for any changes in color or size. Tell your health care  provider if any changes occur.  You may need to avoid activities that increase pressure on your hernia.  Do not lift anything that is heavier than 10 lb (4.5 kg) until your health care provider says that this is safe.  Take over-the-counter and prescription medicines only as told by your health care provider.  Keep all follow-up visits as told by your health care provider. This is important. Contact a health care provider if:  Your hernia gets larger.  Your hernia becomes painful. Get help right away if:  You develop sudden, severe pain near the area of your hernia.  You have pain as well as nausea or vomiting.  You have pain and the skin over your hernia changes color.  You develop a fever. This information is not intended to replace advice given to you by your health care provider. Make sure you discuss any questions you have with your health care provider. Document Released: 03/20/2016 Document Revised: 06/21/2016 Document Reviewed: 03/20/2016 Elsevier Interactive Patient Education  [employer]2018 Elsevier Inc.

## 2017-10-04 ENCOUNTER — Telehealth: Payer: Self-pay | Admitting: Internal Medicine

## 2017-10-04 NOTE — Telephone Encounter (Signed)
Cheryl Dunn, Cheryl Dunn, Cheryl Dunn  Cheryl Dunn, Cheryl Dunn, Cheryl Dunn  Cc: Cheryl Dunn, Cheryl Dunn, Cheryl Dunn        Acceptable risk for surgery - hold Plavix for 5 days prior to procedure and restart after if there is no evidence of bleeding.   Dr. Rennis Dunn     Author: Rennis Dunn, Cheryl AbuKenneth Dunn, Cheryl Dunn Service: Cardiology Author Type: Physician  Filed: 10/04/2017 9:29 AM Encounter Date: 09/30/2017 Status: Signed  Editor: Cheryl Dunn, Cheryl Dunn, Cheryl Dunn (Physician)      Cheryl Dunn was seen in 11/2016 - no anginal complaints. LEA dopplers ordered and found to be normal with a patent stent. She is reported as "being active and works outside. She denies any chest pain or shortness of breath." per Cheryl Dunn - This seems unchanged from my evaluation in January, therefore, she is at acceptable risk to proceed with surgery without further testing. Thanks for notifying me.  Dr. Rennis Dunn

## 2017-10-04 NOTE — Progress Notes (Signed)
[name] was seen in 11/2016 - no anginal complaints. LEA dopplers ordered and found to be normal with a patent stent. She is reported as "being active and works outside. She denies any chest pain or shortness of breath." per Dr. Henreitta LeberBridges - This seems unchanged from my evaluation in January, therefore, she is at acceptable risk to proceed with surgery without further testing. Thanks for notifying me.  Dr. Rennis GoldenHilty

## 2017-10-07 NOTE — H&P (Signed)
[facility] History and Physical  Reason for Referral: Hernia  Referring Physician: Dr. Margo AyeHall     Chief Complaint    Hernia      Cheryl Dunn is a 61 y.o. female.  HPI: Cheryl Dunn is a very pleasant 61 yo with CAD s/p stent and CABG 2002 in [place] who comes in with a few months report of a bulging area above her umbilicus. She says she noticed it first back in the summer, and that since that time whenever she does outside work such as shoveling or lifting that it sticks out more and becomes hard at times.  She denies any vomiting but when she has had it stuck out there has been some nausea. She has BMs irregularly and has to take dulcolax and has soft BMs every 3-4 days when she takes this medication.  She reports having a colonoscopy more than 10 years ago in [place], and is schedule for another in February.   She is active outside, digging holes and shoveling rocks.  She has had an open umbilical hernia repair and a tubal ligation through her umbilicus per her report. In addition to her CABG and a femoral artery repair after an injury following a cardiac catheterization.       Past Medical History:  Diagnosis Date  . CAD (coronary artery disease)    s/p CABG & caths with stenting   . Diabetes (HCC) 08/2013  . Hyperlipidemia   . Hypertension   . Hypothyroidism          Past Surgical History:  Procedure Laterality Date  . APPENDECTOMY    . CHOLECYSTECTOMY  1982  . CORONARY ANGIOPLASTY WITH STENT PLACEMENT  08/2000   stent  . CORONARY ANGIOPLASTY WITH STENT PLACEMENT  06/2009   stent  . CORONARY ARTERY BYPASS GRAFT  12/14/2000  . FEMORAL ARTERY REPAIR  2010   post-cath  . TUBAL LIGATION  1984         Family History  Problem Relation Age of Onset  . Hypertension Mother   . Diabetes Mother   . Hypertension Father   . Diabetes Father   . Heart attack Maternal Grandfather   . Hypertension Paternal Grandmother   . Liver disease  Paternal Grandfather   . Hyperlipidemia Sister     Social History        Tobacco Use  . Smoking status: Former Smoker    Years: 20.00  . Smokeless tobacco: Never Used  . Tobacco comment: 'every now and then' 11/08/14  Substance Use Topics  . Alcohol use: No    Alcohol/week: 0.0 oz  . Drug use: No    Medications: I have reviewed the patient's current medications.     Allergies as of 09/30/2017      Reactions   Amoxicillin Itching   Codeine Diarrhea, Nausea Only   Vicodin [hydrocodone-acetaminophen] Hives                  Medication List             Accurate as of 09/30/17  3:42 PM. Always use your most recent med list.           celecoxib 200 MG capsule Commonly known as:  CELEBREX Take 200 mg by mouth 2 (two) times daily.   clopidogrel 75 MG tablet Commonly known as:  PLAVIX Take 75 mg by mouth daily with breakfast.   FLUTICASONE PROPIONATE (NASAL) NA Place into the nose daily as needed.  glipiZIDE 5 MG tablet Commonly known as:  GLUCOTROL Take 5 mg by mouth daily before breakfast.   hydrochlorothiazide 25 MG tablet Commonly known as:  HYDRODIURIL Take 25 mg by mouth as needed.   isosorbide mononitrate 30 MG 24 hr tablet Commonly known as:  IMDUR Take 30 mg by mouth daily.   metoprolol tartrate 25 MG tablet Commonly known as:  LOPRESSOR Take 25 mg by mouth 2 (two) times daily.   nitroGLYCERIN 0.4 MG SL tablet Commonly known as:  NITROSTAT Place 1 tablet (0.4 mg total) under the tongue every 5 (five) minutes as needed for chest pain.   OVER THE COUNTER MEDICATION daily as needed (sleeping aid). Midnight - contains melatonin   ranitidine 300 MG capsule Commonly known as:  ZANTAC Take 1 capsule by mouth daily.   rosuvastatin 10 MG tablet Commonly known as:  CRESTOR Take 10 mg by mouth daily.   SYNTHROID 150 MCG tablet Generic drug:  levothyroxine Take 1 tablet by mouth daily.        ROS:  A  comprehensive review of systems was negative except for: Constitutional: positive for chills Gastrointestinal: positive for abdominal pain and reflux symptoms Genitourinary: positive for frequency Musculoskeletal: positive for back pain, bone pain and stiff joints Endocrine: positive for tired and sluggish  Blood pressure (!) 147/79, pulse 91, temperature 98.7 F (37.1 C), resp. rate 18, height 5' (1.524 m), weight 159 lb (72.1 kg). Physical Exam  Constitutional: She is oriented to person, place, and time and well-developed, well-nourished, and in no distress.  HENT:  Head: Normocephalic.  Eyes: Pupils are equal, round, and reactive to light.  Neck: Normal range of motion.  Cardiovascular: Normal rate and regular rhythm.  Pulmonary/Chest: Effort normal.  Abdominal: Soft. She exhibits no distension. There is tenderness.  Area of bugling fat supraumbilical about 2cm, nonreducible, difficult to appreciate fascial edges, some tenderness over the area  Musculoskeletal: Normal range of motion.  Neurological: She is alert and oriented to person, place, and time.  Skin: Skin is warm and dry.  Psychiatric: Mood, memory, affect and judgment normal.  Vitals reviewed.   Results: Personally reviewed [place] - hernia with fat and fascial defect likely 1.5cm  [place] 09/14/17 IMPRESSION: Probable ventral hernia containing fat in the supraumbilical region.  Prior cholecystectomy.  Fatty liver.  Assessment & Plan:  Cheryl Dunn is a 61 y.o. female with an epigastric hernia not related to any scars from prior surgeries. Based on the ultrasound and my exam likely just fat containing but could have additional contents at times with her description. This is causing pain and has been stuck out and hard per her report at times. The patient is active and works outside. She denies any chest pain or shortness of breath.  She reports seeing Dr. Ladon Applebaum her Cardiologist 11/2016 and not since that time.     -Cardiac risk stratification to be performed  -Will hold on scheduling until we get clearance from Dr. Ladon Applebaum and then will get patient on the schedule for an open hernia repair with mesh  All questions were answered to the satisfaction of the patient and family.  The risk and benefits of open hernia repair with mesh were discussed including but not limited to bleeding, infection, injury to bowel, recurrence, and need for future surgeries.  After careful consideration, Cheryl Dunn has decided to proceed following her cardiology evaluation.    Lucretia Roers 09/30/2017, 3:42 PM    Dr. Rennis Dunn deemed acceptable risk for surgery. Plan  to hold plavix 5 days prior to surgery (see telephone note from 10/04/17).   Algis GreenhouseLindsay Bridges, MD 10/07/17 4:44PM

## 2017-10-14 ENCOUNTER — Ambulatory Visit: Payer: Managed Care, Other (non HMO) | Admitting: Internal Medicine

## 2017-10-14 NOTE — Patient Instructions (Signed)
Cheryl Dunn  10/14/2017     @PREFPERIOPPHARMACY @   Your procedure is scheduled on  10/22/2017   [facility] at  615   A.M.  Call this number if you have problems the morning of surgery:  [phone]   Remember:  Do not eat food or drink liquids after midnight.  Take these medicines the morning of surgery with A SIP OF WATER  Celebrex, imdur, lopressor, zantac, synthroid.   Do not wear jewelry, make-up or nail polish.  Do not wear lotions, powders, or perfumes, or deodorant.  Do not shave 48 hours prior to surgery.  Men may shave face and neck.  Do not bring valuables to the hospital.  [facility] any belongings or valuables.  Contacts, dentures or bridgework may not be worn into surgery.  Leave your suitcase in the car.  After surgery it may be brought to your room.  For patients admitted to the hospital, discharge time will be determined by your treatment team.  Patients discharged the day of surgery will not be allowed to drive home.   Name and phone number of your driver:   family Special instructions:  None  Please read over the following fact sheets that you were given. Anesthesia Post-op Instructions and Care and Recovery After Surgery       Open Hernia Repair, Adult Open hernia repair is a surgical procedure to fix a hernia. A hernia occurs when an internal organ or tissue pushes out through a weak spot in the abdominal wall muscles. Hernias commonly occur in the groin and around the navel. Most hernias tend to get worse over time. Often, surgery is done to prevent the hernia from becoming bigger, uncomfortable, or an emergency. Emergency surgery may be needed if abdominal contents get stuck in the opening (incarcerated hernia) or the blood supply gets cut off (strangulated hernia). In an open repair, an incision is made in the abdomen to perform the surgery. Tell a health care provider about:  Any allergies you  have.  All medicines you are taking, including vitamins, herbs, eye drops, creams, and over-the-counter medicines.  Any problems you or family members have had with anesthetic medicines.  Any blood or bone disorders you have.  Any surgeries you have had.  Any medical conditions you have, including any recent cold or flu symptoms.  Whether you are pregnant or may be pregnant. What are the risks? Generally, this is a safe procedure. However, problems may occur, including:  Long-lasting (chronic) pain.  Bleeding.  Infection.  Damage to the testicle. This can cause shrinking or swelling.  Damage to the bladder, blood vessels, intestine, or nerves near the hernia.  Trouble passing urine.  Allergic reactions to medicines.  Return of the hernia.  What happens before the procedure? Staying hydrated Follow instructions from your health care provider about hydration, which may include:  Up to 2 hours before the procedure - you may continue to drink clear liquids, such as water, clear fruit juice, black coffee, and plain tea.  Eating and drinking restrictions Follow instructions from your health care provider about eating and drinking, which may include:  8 hours before the procedure - stop eating heavy meals or foods such as meat, fried foods, or fatty foods.  6 hours before the procedure - stop eating light meals or foods, such as toast or cereal.  6 hours before the procedure - stop drinking milk  or drinks that contain milk.  2 hours before the procedure - stop drinking clear liquids.  Medicines  Ask your health care provider about: ? Changing or stopping your regular medicines. This is especially important if you are taking diabetes medicines or blood thinners. ? Taking medicines such as aspirin and ibuprofen. These medicines can thin your blood. Do not take these medicines before your procedure if your health care provider instructs you not to.  You may be given  antibiotic medicine to help prevent infection. General instructions  You may have blood tests or imaging studies.  Ask your health care provider how your surgical site will be marked or identified.  If you smoke, do not smoke for at least 2 weeks before your procedure or for as long as told by your health care provider.  Let your health care provider know if you develop a cold or any infection before your surgery.  Plan to have someone take you home from the hospital or clinic.  If you will be going home right after the procedure, plan to have someone with you for 24 hours. What happens during the procedure?  To reduce your risk of infection: ? Your health care team will wash or sanitize their hands. ? Your skin will be washed with soap. ? Hair may be removed from the surgical area.  An IV tube will be inserted into one of your veins.  You will be given one or more of the following: ? A medicine to help you relax (sedative). ? A medicine to numb the area (local anesthetic). ? A medicine to make you fall asleep (general anesthetic).  Your surgeon will make an incision over the hernia.  The tissues of the hernia will be moved back into place.  The edges of the hernia may be stitched together.  The opening in the abdominal muscles will be closed with stitches (sutures). Or, your surgeon will place a mesh patch made of manmade (synthetic) material over the opening.  The incision will be closed.  A bandage (dressing) may be placed over the incision. The procedure may vary among health care providers and hospitals. What happens after the procedure?  Your blood pressure, heart rate, breathing rate, and blood oxygen level will be monitored until the medicines you were given have worn off.  You may be given medicine for pain.  Do not drive for 24 hours if you received a sedative. This information is not intended to replace advice given to you by your health care provider. Make  sure you discuss any questions you have with your health care provider. Document Released: 04/14/2001 Document Revised: 05/08/2016 Document Reviewed: 04/01/2016 Elsevier Interactive Patient Education  2018 [place], Adult, Care After These instructions give you information about caring for yourself after your procedure. Your doctor may also give you more specific instructions. If you have problems or questions, contact your doctor. Follow these instructions at home: Surgical cut (incision) care   Follow instructions from your doctor about how to take care of your surgical cut area. Make sure you: ? Wash your hands with soap and water before you change your bandage (dressing). If you cannot use soap and water, use hand sanitizer. ? Change your bandage as told by your doctor. ? Leave stitches (sutures), skin glue, or skin tape (adhesive) strips in place. They may need to stay in place for 2 weeks or longer. If tape strips get loose and curl up, you may trim the  loose edges. Do not remove tape strips completely unless your doctor says it is okay.  Check your surgical cut every day for signs of infection. Check for: ? More redness, swelling, or pain. ? More fluid or blood. ? Warmth. ? Pus or a bad smell. Activity  Do not drive or use heavy machinery while taking prescription pain medicine. Do not drive until your doctor says it is okay.  Until your doctor says it is okay: ? Do not lift anything that is heavier than 10 lb (4.5 kg). ? Do not play contact sports.  Return to your normal activities as told by your doctor. Ask your doctor what activities are safe. General instructions  To prevent or treat having a hard time pooping (constipation) while you are taking prescription pain medicine, your doctor may recommend that you: ? Drink enough fluid to keep your pee (urine) clear or pale yellow. ? Take over-the-counter or prescription medicines. ? Eat foods that are  high in fiber, such as fresh fruits and vegetables, whole grains, and beans. ? Limit foods that are high in fat and processed sugars, such as fried and sweet foods.  Take over-the-counter and prescription medicines only as told by your doctor.  Do not take baths, swim, or use a hot tub until your doctor says it is okay.  Keep all follow-up visits as told by your doctor. This is important. Contact a doctor if:  You develop a rash.  You have more redness, swelling, or pain around your surgical cut.  You have more fluid or blood coming from your surgical cut.  Your surgical cut feels warm to the touch.  You have pus or a bad smell coming from your surgical cut.  You have a fever or chills.  You have blood in your poop (stool).  You have not pooped in 2-3 days.  Medicine does not help your pain. Get help right away if:  You have chest pain or you are short of breath.  You feel light-headed.  You feel weak and dizzy (feel faint).  You have very bad pain.  You throw up (vomit) and your pain is worse. This information is not intended to replace advice given to you by your health care provider. Make sure you discuss any questions you have with your health care provider. Document Released: 11/09/2014 Document Revised: 05/08/2016 Document Reviewed: 04/01/2016 Elsevier Interactive Patient Education  2017 [place] Anesthesia, Adult General anesthesia is the use of medicines to make a person "go to sleep" (be unconscious) for a medical procedure. General anesthesia is often recommended when a procedure:  Is long.  Requires you to be still or in an unusual position.  Is major and can cause you to lose blood.  Is impossible to do without general anesthesia.  The medicines used for general anesthesia are called general anesthetics. In addition to making you sleep, the medicines:  Prevent pain.  Control your blood pressure.  Relax your muscles.  Tell a  health care provider about:  Any allergies you have.  All medicines you are taking, including vitamins, herbs, eye drops, creams, and over-the-counter medicines.  Any problems you or family members have had with anesthetic medicines.  Types of anesthetics you have had in the past.  Any bleeding disorders you have.  Any surgeries you have had.  Any medical conditions you have.  Any history of heart or lung conditions, such as heart failure, sleep apnea, or chronic obstructive pulmonary disease (COPD).  Whether  you are pregnant or may be pregnant.  Whether you use tobacco, alcohol, marijuana, or street drugs.  Any history of Armed forces logistics/support/administrative officer.  Any history of depression or anxiety. What are the risks? Generally, this is a safe procedure. However, problems may occur, including:  Allergic reaction to anesthetics.  Lung and heart problems.  Inhaling food or liquids from your stomach into your lungs (aspiration).  Injury to nerves.  Waking up during your procedure and being unable to move (rare).  Extreme agitation or a state of mental confusion (delirium) when you wake up from the anesthetic.  Air in the bloodstream, which can lead to stroke.  These problems are more likely to develop if you are having a major surgery or if you have an advanced medical condition. You can prevent some of these complications by answering all of your health care provider's questions thoroughly and by following all pre-procedure instructions. General anesthesia can cause side effects, including:  Nausea or vomiting  A sore throat from the breathing tube.  Feeling cold or shivery.  Feeling tired, washed out, or achy.  Sleepiness or drowsiness.  Confusion or agitation.  What happens before the procedure? Staying hydrated Follow instructions from your health care provider about hydration, which may include:  Up to 2 hours before the procedure - you may continue to drink clear liquids,  such as water, clear fruit juice, black coffee, and plain tea.  Eating and drinking restrictions Follow instructions from your health care provider about eating and drinking, which may include:  8 hours before the procedure - stop eating heavy meals or foods such as meat, fried foods, or fatty foods.  6 hours before the procedure - stop eating light meals or foods, such as toast or cereal.  6 hours before the procedure - stop drinking milk or drinks that contain milk.  2 hours before the procedure - stop drinking clear liquids.  Medicines  Ask your health care provider about: ? Changing or stopping your regular medicines. This is especially important if you are taking diabetes medicines or blood thinners. ? Taking medicines such as aspirin and ibuprofen. These medicines can thin your blood. Do not take these medicines before your procedure if your health care provider instructs you not to. ? Taking new dietary supplements or medicines. Do not take these during the week before your procedure unless your health care provider approves them.  If you are told to take a medicine or to continue taking a medicine on the day of the procedure, take the medicine with sips of water. General instructions   Ask if you will be going home the same day, the following day, or after a longer hospital stay. ? Plan to have someone take you home. ? Plan to have someone stay with you for the first 24 hours after you leave the hospital or clinic.  For 3-6 weeks before the procedure, try not to use any tobacco products, such as cigarettes, chewing tobacco, and e-cigarettes.  You may brush your teeth on the morning of the procedure, but make sure to spit out the toothpaste. What happens during the procedure?  You will be given anesthetics through a mask and through an IV tube in one of your veins.  You may receive medicine to help you relax (sedative).  As soon as you are asleep, a breathing tube may be  used to help you breathe.  An anesthesia specialist will stay with you throughout the procedure. He or she will help keep  you comfortable and safe by continuing to give you medicines and adjusting the amount of medicine that you get. He or she will also watch your blood pressure, pulse, and oxygen levels to make sure that the anesthetics do not cause any problems.  If a breathing tube was used to help you breathe, it will be removed before you wake up. The procedure may vary among health care providers and hospitals. What happens after the procedure?  You will wake up, often slowly, after the procedure is complete, usually in a recovery area.  Your blood pressure, heart rate, breathing rate, and blood oxygen level will be monitored until the medicines you were given have worn off.  You may be given medicine to help you calm down if you feel anxious or agitated.  If you will be going home the same day, your health care provider may check to make sure you can stand, drink, and urinate.  Your health care providers will treat your pain and side effects before you go home.  Do not drive for 24 hours if you received a sedative.  You may: ? Feel nauseous and vomit. ? Have a sore throat. ? Have mental slowness. ? Feel cold or shivery. ? Feel sleepy. ? Feel tired. ? Feel sore or achy, even in parts of your body where you did not have surgery. This information is not intended to replace advice given to you by your health care provider. Make sure you discuss any questions you have with your health care provider. Document Released: 01/26/2008 Document Revised: 03/31/2016 Document Reviewed: 10/03/2015 Elsevier Interactive Patient Education  2018 [place] Anesthesia, Adult, Care After These instructions provide you with information about caring for yourself after your procedure. Your health care provider may also give you more specific instructions. Your treatment has been planned  according to current medical practices, but problems sometimes occur. Call your health care provider if you have any problems or questions after your procedure. What can I expect after the procedure? After the procedure, it is common to have:  Vomiting.  A sore throat.  Mental slowness.  It is common to feel:  Nauseous.  Cold or shivery.  Sleepy.  Tired.  Sore or achy, even in parts of your body where you did not have surgery.  Follow these instructions at home: For at least 24 hours after the procedure:  Do not: ? Participate in activities where you could fall or become injured. ? Drive. ? Use heavy machinery. ? Drink alcohol. ? Take sleeping pills or medicines that cause drowsiness. ? Make important decisions or sign legal documents. ? Take care of children on your own.  Rest. Eating and drinking  If you vomit, drink water, juice, or soup when you can drink without vomiting.  Drink enough fluid to keep your urine clear or pale yellow.  Make sure you have little or no nausea before eating solid foods.  Follow the diet recommended by your health care provider. General instructions  Have a responsible adult stay with you until you are awake and alert.  Return to your normal activities as told by your health care provider. Ask your health care provider what activities are safe for you.  Take over-the-counter and prescription medicines only as told by your health care provider.  If you smoke, do not smoke without supervision.  Keep all follow-up visits as told by your health care provider. This is important. Contact a health care provider if:  You continue to have  nausea or vomiting at home, and medicines are not helpful.  You cannot drink fluids or start eating again.  You cannot urinate after 8-12 hours.  You develop a skin rash.  You have fever.  You have increasing redness at the site of your procedure. Get help right away if:  You have  difficulty breathing.  You have chest pain.  You have unexpected bleeding.  You feel that you are having a life-threatening or urgent problem. This information is not intended to replace advice given to you by your health care provider. Make sure you discuss any questions you have with your health care provider. Document Released: 01/25/2001 Document Revised: 03/23/2016 Document Reviewed: 10/03/2015 Elsevier Interactive Patient Education  [employer].

## 2017-10-18 ENCOUNTER — Encounter (HOSPITAL_COMMUNITY): Payer: Self-pay

## 2017-10-18 ENCOUNTER — Encounter (HOSPITAL_COMMUNITY)
Admission: RE | Admit: 2017-10-18 | Discharge: 2017-10-18 | Disposition: A | Discharge status: Home / Self Care | Payer: Managed Care, Other (non HMO) | Source: Ambulatory Visit | Attending: General Surgery | Admitting: General Surgery

## 2017-10-18 ENCOUNTER — Other Ambulatory Visit: Payer: Self-pay

## 2017-10-18 DIAGNOSIS — Z01812 Encounter for preprocedural laboratory examination: Secondary | ICD-10-CM | POA: Diagnosis not present

## 2017-10-18 HISTORY — DX: Gastro-esophageal reflux disease without esophagitis: K21.9

## 2017-10-18 HISTORY — DX: Unspecified osteoarthritis, unspecified site: M19.90

## 2017-10-18 LAB — CBC WITH DIFFERENTIAL/PLATELET
Basophils Absolute: 0 10*3/uL (ref 0.0–0.1)
Basophils Relative: 0 %
Eosinophils Absolute: 0.1 10*3/uL (ref 0.0–0.7)
Eosinophils Relative: 1 %
HCT: 41.9 % (ref 36.0–46.0)
Hemoglobin: 13.5 g/dL (ref 12.0–15.0)
Lymphocytes Relative: 29 %
Lymphs Abs: 1.7 10*3/uL (ref 0.7–4.0)
MCH: 29 pg (ref 26.0–34.0)
MCHC: 32.2 g/dL (ref 30.0–36.0)
MCV: 90.1 fL (ref 78.0–100.0)
Monocytes Absolute: 0.5 10*3/uL (ref 0.1–1.0)
Monocytes Relative: 8 %
Neutro Abs: 3.6 10*3/uL (ref 1.7–7.7)
Neutrophils Relative %: 62 %
Platelets: 238 10*3/uL (ref 150–400)
RBC: 4.65 MIL/uL (ref 3.87–5.11)
RDW: 13.1 % (ref 11.5–15.5)
WBC: 5.8 10*3/uL (ref 4.0–10.5)

## 2017-10-18 LAB — BASIC METABOLIC PANEL
Anion gap: 6 (ref 5–15)
BUN: 10 mg/dL (ref 6–20)
CO2: 26 mmol/L (ref 22–32)
Calcium: 9.4 mg/dL (ref 8.9–10.3)
Chloride: 106 mmol/L (ref 101–111)
Creatinine, Ser: 0.71 mg/dL (ref 0.44–1.00)
GFR calc Af Amer: >60 mL/min (ref >60)
GFR calc non Af Amer: >60 mL/min (ref >60)
Glucose, Bld: 104 mg/dL — ABNORMAL HIGH (ref 65–99)
Potassium: 4 mmol/L (ref 3.5–5.1)
Sodium: 138 mmol/L (ref 135–145)

## 2017-10-18 LAB — GLUCOSE, CAPILLARY: Glucose-Capillary: 106 mg/dL — ABNORMAL HIGH (ref 65–99)

## 2017-10-19 LAB — HEMOGLOBIN A1C
Hgb A1c MFr Bld: 7 % — ABNORMAL HIGH (ref 4.8–5.6)
Mean Plasma Glucose: 154 mg/dL

## 2017-10-22 ENCOUNTER — Ambulatory Visit (HOSPITAL_COMMUNITY): Payer: Managed Care, Other (non HMO) | Admitting: Anesthesiology

## 2017-10-22 ENCOUNTER — Encounter (HOSPITAL_COMMUNITY): Payer: Self-pay | Admitting: General Surgery

## 2017-10-22 ENCOUNTER — Encounter (HOSPITAL_COMMUNITY)
Admission: RE | Disposition: A | Discharge status: Home / Self Care | Payer: Self-pay | Source: Ambulatory Visit | Attending: General Surgery

## 2017-10-22 ENCOUNTER — Ambulatory Visit (HOSPITAL_COMMUNITY)
Admission: RE | Admit: 2017-10-22 | Discharge: 2017-10-22 | Disposition: A | Discharge status: Home / Self Care | Payer: Managed Care, Other (non HMO) | Source: Ambulatory Visit | Attending: General Surgery | Admitting: General Surgery

## 2017-10-22 DIAGNOSIS — Z951 Presence of aortocoronary bypass graft: Secondary | ICD-10-CM | POA: Diagnosis not present

## 2017-10-22 DIAGNOSIS — Z9889 Other specified postprocedural states: Secondary | ICD-10-CM | POA: Insufficient documentation

## 2017-10-22 DIAGNOSIS — K436 Other and unspecified ventral hernia with obstruction, without gangrene: Secondary | ICD-10-CM | POA: Diagnosis not present

## 2017-10-22 DIAGNOSIS — Z7984 Long term (current) use of oral hypoglycemic drugs: Secondary | ICD-10-CM | POA: Diagnosis not present

## 2017-10-22 DIAGNOSIS — Z885 Allergy status to narcotic agent status: Secondary | ICD-10-CM | POA: Insufficient documentation

## 2017-10-22 DIAGNOSIS — I10 Essential (primary) hypertension: Secondary | ICD-10-CM | POA: Insufficient documentation

## 2017-10-22 DIAGNOSIS — E785 Hyperlipidemia, unspecified: Secondary | ICD-10-CM | POA: Insufficient documentation

## 2017-10-22 DIAGNOSIS — Z79899 Other long term (current) drug therapy: Secondary | ICD-10-CM | POA: Insufficient documentation

## 2017-10-22 DIAGNOSIS — I2510 Atherosclerotic heart disease of native coronary artery without angina pectoris: Secondary | ICD-10-CM | POA: Diagnosis not present

## 2017-10-22 DIAGNOSIS — Z87891 Personal history of nicotine dependence: Secondary | ICD-10-CM | POA: Insufficient documentation

## 2017-10-22 DIAGNOSIS — Z880 Allergy status to penicillin: Secondary | ICD-10-CM | POA: Diagnosis not present

## 2017-10-22 DIAGNOSIS — E039 Hypothyroidism, unspecified: Secondary | ICD-10-CM | POA: Diagnosis not present

## 2017-10-22 DIAGNOSIS — Z955 Presence of coronary angioplasty implant and graft: Secondary | ICD-10-CM | POA: Insufficient documentation

## 2017-10-22 DIAGNOSIS — Z9049 Acquired absence of other specified parts of digestive tract: Secondary | ICD-10-CM | POA: Diagnosis not present

## 2017-10-22 DIAGNOSIS — E119 Type 2 diabetes mellitus without complications: Secondary | ICD-10-CM | POA: Diagnosis not present

## 2017-10-22 DIAGNOSIS — K760 Fatty (change of) liver, not elsewhere classified: Secondary | ICD-10-CM | POA: Insufficient documentation

## 2017-10-22 HISTORY — PX: UMBILICAL HERNIA REPAIR: SHX196

## 2017-10-22 LAB — GLUCOSE, CAPILLARY
Glucose-Capillary: 177 mg/dL — ABNORMAL HIGH (ref 65–99)
Glucose-Capillary: 179 mg/dL — ABNORMAL HIGH (ref 65–99)

## 2017-10-22 SURGERY — REPAIR, HERNIA, UMBILICAL, ADULT
Anesthesia: General

## 2017-10-22 MED ORDER — ONDANSETRON HCL 4 MG/2ML IJ SOLN
4.0000 mg | Freq: Once | INTRAMUSCULAR | Status: AC
  Administered 2017-10-22: 4 mg via INTRAVENOUS

## 2017-10-22 MED ORDER — SUCCINYLCHOLINE CHLORIDE 20 MG/ML IJ SOLN
INTRAMUSCULAR | Status: AC
  Filled 2017-10-22: qty 1

## 2017-10-22 MED ORDER — GLYCOPYRROLATE 0.2 MG/ML IJ SOLN
INTRAMUSCULAR | Status: DC | PRN
  Administered 2017-10-22: 0.6 mg via INTRAVENOUS

## 2017-10-22 MED ORDER — LACTATED RINGERS IV SOLN
INTRAVENOUS | Status: DC
  Administered 2017-10-22: 07:00:00 via INTRAVENOUS

## 2017-10-22 MED ORDER — BUPIVACAINE LIPOSOME 1.3 % IJ SUSP
INTRAMUSCULAR | Status: AC
  Filled 2017-10-22: qty 20

## 2017-10-22 MED ORDER — MIDAZOLAM HCL 2 MG/2ML IJ SOLN
INTRAMUSCULAR | Status: AC
  Filled 2017-10-22: qty 2

## 2017-10-22 MED ORDER — BUPIVACAINE LIPOSOME 1.3 % IJ SUSP
INTRAMUSCULAR | Status: DC | PRN
  Administered 2017-10-22: 20 mL

## 2017-10-22 MED ORDER — PROPOFOL 10 MG/ML IV BOLUS
INTRAVENOUS | Status: DC | PRN
  Administered 2017-10-22: 150 mg via INTRAVENOUS

## 2017-10-22 MED ORDER — SODIUM CHLORIDE 0.9% FLUSH
INTRAVENOUS | Status: AC
Start: 2017-10-22 — End: ?
  Filled 2017-10-22: qty 10

## 2017-10-22 MED ORDER — CHLORHEXIDINE GLUCONATE CLOTH 2 % EX PADS: 6.0000 | MEDICATED_PAD | Freq: Once | CUTANEOUS | Status: DC

## 2017-10-22 MED ORDER — NEOSTIGMINE METHYLSULFATE 10 MG/10ML IV SOLN
INTRAVENOUS | Status: DC | PRN
  Administered 2017-10-22: 4 mg via INTRAVENOUS

## 2017-10-22 MED ORDER — NEOSTIGMINE METHYLSULFATE 10 MG/10ML IV SOLN
INTRAVENOUS | Status: AC
  Filled 2017-10-22: qty 2

## 2017-10-22 MED ORDER — SODIUM CHLORIDE 0.9 % IR SOLN
Status: DC | PRN
  Administered 2017-10-22: 1000 mL

## 2017-10-22 MED ORDER — PROMETHAZINE HCL 6.25 MG/5ML PO SYRP
6.2500 mg | ORAL_SOLUTION | Freq: Once | ORAL | Status: DC
  Filled 2017-10-22: qty 5

## 2017-10-22 MED ORDER — PROMETHAZINE HCL 25 MG/ML IJ SOLN
6.2500 mg | Freq: Once | INTRAMUSCULAR | Status: AC
  Administered 2017-10-22: 6.25 mg via INTRAVENOUS

## 2017-10-22 MED ORDER — PROMETHAZINE HCL 25 MG/ML IJ SOLN
INTRAMUSCULAR | Status: AC
  Filled 2017-10-22: qty 1

## 2017-10-22 MED ORDER — FENTANYL CITRATE (PF) 100 MCG/2ML IJ SOLN: 25.0000 ug | INTRAMUSCULAR | Status: DC | PRN

## 2017-10-22 MED ORDER — GLYCOPYRROLATE 0.2 MG/ML IJ SOLN
INTRAMUSCULAR | Status: AC
  Filled 2017-10-22: qty 1

## 2017-10-22 MED ORDER — GLYCOPYRROLATE 0.2 MG/ML IJ SOLN
INTRAMUSCULAR | Status: AC
  Filled 2017-10-22: qty 3

## 2017-10-22 MED ORDER — LIDOCAINE HCL (PF) 1 % IJ SOLN
INTRAMUSCULAR | Status: AC
  Filled 2017-10-22: qty 15

## 2017-10-22 MED ORDER — DOCUSATE SODIUM 100 MG PO CAPS: 100.0000 mg | ORAL_CAPSULE | Freq: Two times a day (BID) | ORAL | 2 refills | Status: AC

## 2017-10-22 MED ORDER — FENTANYL CITRATE (PF) 100 MCG/2ML IJ SOLN
INTRAMUSCULAR | Status: DC | PRN
  Administered 2017-10-22 (×5): 50 ug via INTRAVENOUS

## 2017-10-22 MED ORDER — DEXAMETHASONE SODIUM PHOSPHATE 4 MG/ML IJ SOLN
4.0000 mg | Freq: Once | INTRAMUSCULAR | Status: AC
  Administered 2017-10-22: 4 mg via INTRAVENOUS

## 2017-10-22 MED ORDER — ONDANSETRON HCL 4 MG/2ML IJ SOLN
INTRAMUSCULAR | Status: AC
  Filled 2017-10-22: qty 2

## 2017-10-22 MED ORDER — ROCURONIUM BROMIDE 50 MG/5ML IV SOLN
INTRAVENOUS | Status: AC
  Filled 2017-10-22: qty 1

## 2017-10-22 MED ORDER — MIDAZOLAM HCL 2 MG/2ML IJ SOLN
1.0000 mg | INTRAMUSCULAR | Status: AC
  Administered 2017-10-22: 2 mg via INTRAVENOUS

## 2017-10-22 MED ORDER — DEXAMETHASONE SODIUM PHOSPHATE 4 MG/ML IJ SOLN
INTRAMUSCULAR | Status: AC
  Filled 2017-10-22: qty 1

## 2017-10-22 MED ORDER — VANCOMYCIN HCL IN DEXTROSE 1-5 GM/200ML-% IV SOLN
1000.0000 mg | INTRAVENOUS | Status: AC
  Administered 2017-10-22: 1000 mg via INTRAVENOUS
  Filled 2017-10-22: qty 200

## 2017-10-22 MED ORDER — PROPOFOL 10 MG/ML IV BOLUS
INTRAVENOUS | Status: AC
  Filled 2017-10-22: qty 40

## 2017-10-22 MED ORDER — FENTANYL CITRATE (PF) 250 MCG/5ML IJ SOLN
INTRAMUSCULAR | Status: AC
  Filled 2017-10-22: qty 5

## 2017-10-22 MED ORDER — SUCCINYLCHOLINE CHLORIDE 20 MG/ML IJ SOLN
INTRAMUSCULAR | Status: DC | PRN
  Administered 2017-10-22: 140 mg via INTRAVENOUS

## 2017-10-22 MED ORDER — LIDOCAINE HCL (CARDIAC) 10 MG/ML IV SOLN
INTRAVENOUS | Status: DC | PRN
  Administered 2017-10-22: 50 mg via INTRAVENOUS

## 2017-10-22 MED ORDER — OXYCODONE HCL 5 MG PO TABS: 5.0000 mg | ORAL_TABLET | ORAL | 0 refills | Status: DC | PRN

## 2017-10-22 MED ORDER — ROCURONIUM BROMIDE 100 MG/10ML IV SOLN
INTRAVENOUS | Status: DC | PRN
  Administered 2017-10-22: 5 mg via INTRAVENOUS
  Administered 2017-10-22: 25 mg via INTRAVENOUS

## 2017-10-22 MED ORDER — BUPIVACAINE HCL (PF) 0.5 % IJ SOLN
INTRAMUSCULAR | Status: AC
  Filled 2017-10-22: qty 30

## 2017-10-22 SURGICAL SUPPLY — 33 items
BAG HAMPER (MISCELLANEOUS) ×2 IMPLANT
BLADE SURG 15 STRL LF DISP TIS (BLADE) ×1 IMPLANT
BLADE SURG 15 STRL SS (BLADE) ×1
BLADE SURG SZ11 CARB STEEL (BLADE) ×2 IMPLANT
CHLORAPREP W/TINT 26ML (MISCELLANEOUS) ×2 IMPLANT
CLOTH BEACON ORANGE TIMEOUT ST (SAFETY) ×2 IMPLANT
COVER LIGHT HANDLE STERIS (MISCELLANEOUS) ×4 IMPLANT
DERMABOND ADVANCED (GAUZE/BANDAGES/DRESSINGS) ×1
DERMABOND ADVANCED .7 DNX12 (GAUZE/BANDAGES/DRESSINGS) ×1 IMPLANT
ELECT REM PT RETURN 9FT ADLT (ELECTROSURGICAL) ×2
ELECTRODE REM PT RTRN 9FT ADLT (ELECTROSURGICAL) ×1 IMPLANT
GLOVE BIO SURGEON STRL SZ 6.5 (GLOVE) ×2 IMPLANT
GLOVE BIOGEL PI IND STRL 6.5 (GLOVE) ×2 IMPLANT
GLOVE BIOGEL PI IND STRL 7.0 (GLOVE) ×1 IMPLANT
GLOVE BIOGEL PI INDICATOR 6.5 (GLOVE) ×2
GLOVE BIOGEL PI INDICATOR 7.0 (GLOVE) ×1
GLOVE ECLIPSE 6.5 STRL STRAW (GLOVE) ×2 IMPLANT
GOWN STRL REUS W/ TWL LRG LVL3 (GOWN DISPOSABLE) ×1 IMPLANT
GOWN STRL REUS W/TWL LRG LVL3 (GOWN DISPOSABLE) ×3 IMPLANT
INST SET MINOR GENERAL (KITS) ×2 IMPLANT
KIT ROOM TURNOVER APOR (KITS) ×2 IMPLANT
MANIFOLD NEPTUNE II (INSTRUMENTS) ×2 IMPLANT
MESH VENTRALEX ST 1-7/10 CRC S (Mesh General) ×2 IMPLANT
NEEDLE HYPO 21X1.5 SAFETY (NEEDLE) ×2 IMPLANT
NS IRRIG 1000ML POUR BTL (IV SOLUTION) ×2 IMPLANT
PACK MINOR (CUSTOM PROCEDURE TRAY) ×2 IMPLANT
PAD ARMBOARD 7.5X6 YLW CONV (MISCELLANEOUS) ×2 IMPLANT
SET BASIN LINEN APH (SET/KITS/TRAYS/PACK) ×2 IMPLANT
SUT ETHIBOND NAB MO 7 #0 18IN (SUTURE) ×2 IMPLANT
SUT VIC AB 2-0 CT2 27 (SUTURE) ×2 IMPLANT
SUT VIC AB 3-0 SH 27 (SUTURE) ×1
SUT VIC AB 3-0 SH 27XBRD (SUTURE) ×1 IMPLANT
SYR 20CC LL (SYRINGE) ×2 IMPLANT

## 2017-10-22 NOTE — Anesthesia Procedure Notes (Signed)
Procedure Name: Intubation Date/Time: 10/22/2017 7:53 AM Performed by: Franco NonesYates, Celinda Dethlefs S, CRNA Pre-anesthesia Checklist: Patient identified, Emergency Drugs available, Suction available, Patient being monitored and Timeout performed Patient Re-evaluated:Patient Re-evaluated prior to induction Oxygen Delivery Method: Circle system utilized Preoxygenation: Pre-oxygenation with 100% oxygen Induction Type: IV induction, Rapid sequence and Cricoid Pressure applied Ventilation: Mask ventilation without difficulty Laryngoscope Size: Glidescope and 3 Grade View: Grade I Tube type: Oral Tube size: 7.0 mm Number of attempts: 1 Airway Equipment and Method: Video-laryngoscopy,  Stylet and Oral airway Placement Confirmation: ETT inserted through vocal cords under direct vision,  positive ETCO2 and breath sounds checked- equal and bilateral Secured at: 22 cm Tube secured with: Tape Dental Injury: Teeth and Oropharynx as per pre-operative assessment

## 2017-10-22 NOTE — Op Note (Signed)
[facility] Operative Note  10/22/17  Preoperative Diagnosis: Epigastric hernia with incarcerated omentum    Postoperative Diagnosis: Same   Procedure(s) Performed:  Epigastric hernia repair with mesh    Surgeon: Leatrice JewelsLindsay C. Henreitta LeberBridges, MD   Assistants: No qualified resident was available   Anesthesia: General endotracheal   Anesthesiologist: Dr. Jayme CloudGonzalez    Specimens: None   Estimated Blood Loss: Minimal   Blood Replacement: None    Complications: None   Wound Class: Clean    Operative Indications: [name] is a 61 yo female who is active and has noticed a bulge above her umbilicus that enlarges with activity. She reported discomfort in the area, and has been found to have an epigastric hernia with incarcerated contents that felt like omentum.   Findings: Small epigastric defect with omentum incarcerated in the area    Procedure: The patient was taken to the operating room and placed supine. General endotracheal anesthesia was induced. Intravenous antibiotics were administered per protocol.  The abdomen was prepared and draped in the usual sterile fashion.  A vertical incision was made above the umbilicus in the area where the hernia defect was marked while the patient was awake.  The incision was carried down through the subcutaneous tissue, and fatty tissue was encountered with components of preperitoneal fat in addition to a hernia sac containing omentum that was not reducible.  The fascia was cleared off an the fascial defect was less than 1 cm, and the hernia sac and preperitoneal fat was amputated and reduced back into the abdomen. The fascia was clear surround the defect, and a small Ventralex circular mesh was placed intraperitoneal and secured to the fascia by the tails with 0 Ethibond suture.  The fascia was then closed over the mesh with interrupted 0 Ethibond in a vest over pants fashion.  There was a significant amount of dead space from the omentum and  preperitoneal fat that had been occupying the area, and an attempt at closing the dead space wit 2-0 Vicryl suture was made, taking care to maintain the contour of the abdomen.  The deep dermal layer was closed with a 3-0 Vicryl and the skin was closed with a 4-0 Monocryl and Dermabond.    Final inspection revealed acceptable hemostasis. All counts were correct at the end of the case. The patient was awakened from anesthesia and extubate without complication.  The patient went to the PACU in stable condition.   Algis GreenhouseLindsay Darell Saputo, MD [facility] [address] [place], [place] [id number] [phone] (office)

## 2017-10-22 NOTE — Transfer of Care (Signed)
Immediate Anesthesia Transfer of Care Note  Patient: Cheryl Dunn  Procedure(s) Performed: OPEN EPIGASTRIC HERNIA REPAIR WITH MESH (N/A )  Patient Location: PACU  Anesthesia Type:General  Level of Consciousness: awake, alert  and patient cooperative  Airway & Oxygen Therapy: Patient Spontanous Breathing and non-rebreather face mask  Post-op Assessment: Report given to RN and Post -op Vital signs reviewed and stable  Post vital signs: Reviewed and stable  Last Vitals:  Vitals:   10/22/17 0725 10/22/17 0730  BP: 107/67 117/71  Pulse:    Resp: (!) 24 11  Temp:    SpO2: 95% 96%    Last Pain:  Vitals:   10/22/17 0653  TempSrc: Oral      Patients Stated Pain Goal: 8 (10/22/17 0653)  Complications: No apparent anesthesia complications

## 2017-10-22 NOTE — Discharge Instructions (Signed)
Discharge Instructions: Shower per your regular routine. Take tylenol and ibuprofen as needed for pain control, alternating every 4-6 hours.  Take Roxicodone for breakthrough pain. Take colace for constipation related to narcotic pain medication. Do not pick at the dermabond glue on your incision sites.  Restart your Plavix on 10/25/2017.   Open Hernia Repair, Adult, Care After These instructions give you information about caring for yourself after your procedure. Your doctor may also give you more specific instructions. If you have problems or questions, contact your doctor. Follow these instructions at home: Surgical cut (incision) care   Follow instructions from your doctor about how to take care of your surgical cut area. Make sure you: ? Wash your hands with soap and water before you change your bandage (dressing). If you cannot use soap and water, use hand sanitizer. ? Change your bandage as told by your doctor. ? Leave stitches (sutures), skin glue, or skin tape (adhesive) strips in place. They may need to stay in place for 2 weeks or longer. If tape strips get loose and curl up, you may trim the loose edges. Do not remove tape strips completely unless your doctor says it is okay.  Check your surgical cut every day for signs of infection. Check for: ? More redness, swelling, or pain. ? More fluid or blood. ? Warmth. ? Pus or a bad smell. Activity  Do not drive or use heavy machinery while taking prescription pain medicine. Do not drive until your doctor says it is okay.  Until your doctor says it is okay: ? Do not lift anything that is heavier than 10 lb (4.5 kg). ? Do not play contact sports.  Return to your normal activities as told by your doctor. Ask your doctor what activities are safe. General instructions  To prevent or treat having a hard time pooping (constipation) while you are taking prescription pain medicine, your doctor may recommend that you: ? Drink enough  fluid to keep your pee (urine) clear or pale yellow. ? Take over-the-counter or prescription medicines. ? Eat foods that are high in fiber, such as fresh fruits and vegetables, whole grains, and beans. ? Limit foods that are high in fat and processed sugars, such as fried and sweet foods.  Take over-the-counter and prescription medicines only as told by your doctor.  Do not take baths, swim, or use a hot tub until your doctor says it is okay.  Keep all follow-up visits as told by your doctor. This is important. Contact a doctor if:  You develop a rash.  You have more redness, swelling, or pain around your surgical cut.  You have more fluid or blood coming from your surgical cut.  Your surgical cut feels warm to the touch.  You have pus or a bad smell coming from your surgical cut.  You have a fever or chills.  You have blood in your poop (stool).  You have not pooped in 2-3 days.  Medicine does not help your pain. Get help right away if:  You have chest pain or you are short of breath.  You feel light-headed.  You feel weak and dizzy (feel faint).  You have very bad pain.  You throw up (vomit) and your pain is worse. This information is not intended to replace advice given to you by your health care provider. Make sure you discuss any questions you have with your health care provider. Document Released: 11/09/2014 Document Revised: 05/08/2016 Document Reviewed: 04/01/2016 Elsevier Interactive Patient Education  2018 [employer].

## 2017-10-22 NOTE — Interval H&P Note (Signed)
History and Physical Interval Note:  10/22/2017 7:26 AM  Cheryl KendallBobbi Clide CliffKline  has presented today for surgery, with the diagnosis of epigastric hernia  The various methods of treatment have been discussed with the patient and family. After consideration of risks, benefits and other options for treatment, the patient has consented to  Procedure(s): OPEN EPIGASTRIC HERNIA REPAIR WITH MESH (N/A) as a surgical intervention .  The patient's history has been reviewed, patient examined, no change in status, stable for surgery.  I have reviewed the patient's chart and labs.  Questions were answered to the patient's satisfaction.    No new questions or concerns. Marked area.  [name]

## 2017-10-22 NOTE — Anesthesia Preprocedure Evaluation (Signed)
Anesthesia Evaluation  Patient identified by MRN, date of birth, ID band Patient awake    Reviewed: Allergy & Precautions, NPO status , Patient's Chart, lab work & pertinent test results, reviewed documented beta blocker date and time   Airway Mallampati: III  TM Distance: <3 FB Neck ROM: Full    Dental  (+) Teeth Intact   Pulmonary former smoker,    breath sounds clear to auscultation       Cardiovascular hypertension, Pt. on medications and Pt. on home beta blockers (-) angina+ CAD, + Cardiac Stents and + CABG  (-) DOE  Rhythm:Regular Rate:Normal     Neuro/Psych negative psych ROS   GI/Hepatic GERD  Medicated and Controlled,  Endo/Other  diabetes, Type 2, Oral Hypoglycemic AgentsHypothyroidism   Renal/GU      Musculoskeletal   Abdominal   Peds  Hematology   Anesthesia Other Findings   Reproductive/Obstetrics                             Anesthesia Physical Anesthesia Plan  ASA: III  Anesthesia Plan: General   Post-op Pain Management:    Induction: Intravenous, Rapid sequence and Cricoid pressure planned  PONV Risk Score and Plan:   Airway Management Planned: Oral ETT and Video Laryngoscope Planned  Additional Equipment:   Intra-op Plan:   Post-operative Plan: Extubation in OR  Informed Consent: I have reviewed the patients History and Physical, chart, labs and discussed the procedure including the risks, benefits and alternatives for the proposed anesthesia with the patient or authorized representative who has indicated his/her understanding and acceptance.     Plan Discussed with:   Anesthesia Plan Comments:         Anesthesia Quick Evaluation

## 2017-10-22 NOTE — Anesthesia Postprocedure Evaluation (Signed)
Anesthesia Post Note  Patient: Cheryl Dunn  Procedure(s) Performed: OPEN EPIGASTRIC HERNIA REPAIR WITH MESH (N/A )  Patient location during evaluation: PACU Anesthesia Type: General Level of consciousness: awake and alert Pain management: satisfactory to patient Vital Signs Assessment: post-procedure vital signs reviewed and stable Respiratory status: spontaneous breathing Cardiovascular status: stable Postop Assessment: no apparent nausea or vomiting Anesthetic complications: no     Last Vitals:  Vitals:   10/22/17 0946 10/22/17 1004  BP: (!) 141/81 (!) 147/84  Pulse: 89 75  Resp: 11   Temp:  (!) 36.2 C  SpO2: 95% 96%    Last Pain:  Vitals:   10/22/17 1004  TempSrc: Axillary  PainSc: 5                  Jaia Alonge

## 2017-11-03 DIAGNOSIS — I10 Essential (primary) hypertension: Secondary | ICD-10-CM | POA: Insufficient documentation

## 2017-11-04 ENCOUNTER — Ambulatory Visit (INDEPENDENT_AMBULATORY_CARE_PROVIDER_SITE_OTHER): Payer: Self-pay | Admitting: General Surgery

## 2017-11-04 VITALS — BP 137/84 | HR 82 | Temp 97.8°F | Resp 18 | Ht 60.0 in | Wt 159.0 lb

## 2017-11-04 DIAGNOSIS — K439 Ventral hernia without obstruction or gangrene: Secondary | ICD-10-CM

## 2017-11-05 ENCOUNTER — Encounter: Payer: Self-pay | Admitting: General Surgery

## 2017-11-05 NOTE — Progress Notes (Signed)
[facility] Note   HPI:  62 y.o. Female presents to clinic for post-op follow-up evaluation after an epigastric hernia repair with mesh. She is doing well and says she has been having some minor discomfort. She is eating and drinking well, and says she notices the discomfort with her pants over the area.   Review of Systems:  No fevers or chills No drainage  All other review of systems: otherwise negative   Vital Signs:  BP 137/84   Pulse 82   Temp 97.8 F (36.6 C)   Resp 18   Ht 5' (1.524 m)   Wt 159 lb (72.1 kg)   BMI 31.05 kg/m    Physical Exam:  Physical Exam  Cardiovascular: Normal rate.  Pulmonary/Chest: Effort normal.  Abdominal: Soft. She exhibits no distension. There is no tenderness.  Incision clean and intact superior to the umbilicus, no erythema or drainage, some induration   Vitals reviewed.   Laboratory studies: None   Imaging:  None   Assessment:  62 y.o. yo Female s/p epigastric hernia repair with mesh. She is doing well. Feeling good. Eager to get back to doing exercising and outside chores. Understands to not lift more than 10 lbs until late February as her surgery was 10/22/2017.  Plan:  - Follow up PRN  - Induration should soften over time   Algis GreenhouseLindsay Emogene Muratalla, MD [facility] [address] [place], [place] [id number] [phone] (office)

## 2017-11-30 ENCOUNTER — Encounter: Payer: Self-pay | Admitting: Internal Medicine

## 2017-11-30 ENCOUNTER — Ambulatory Visit: Payer: Managed Care, Other (non HMO) | Admitting: Internal Medicine

## 2017-11-30 VITALS — BP 100/68 | HR 81 | Ht 60.0 in | Wt 160.4 lb

## 2017-11-30 DIAGNOSIS — Z951 Presence of aortocoronary bypass graft: Secondary | ICD-10-CM | POA: Diagnosis not present

## 2017-11-30 DIAGNOSIS — E785 Hyperlipidemia, unspecified: Secondary | ICD-10-CM | POA: Diagnosis not present

## 2017-11-30 DIAGNOSIS — I10 Essential (primary) hypertension: Secondary | ICD-10-CM

## 2017-11-30 MED ORDER — NITROGLYCERIN 0.4 MG SL SUBL: 0.4000 mg | SUBLINGUAL_TABLET | SUBLINGUAL | 3 refills | Status: DC | PRN

## 2017-11-30 MED ORDER — EZETIMIBE 10 MG PO TABS: 10.0000 mg | ORAL_TABLET | Freq: Every day | ORAL | 3 refills | Status: DC

## 2017-11-30 NOTE — Patient Instructions (Addendum)
Medication Instructions:   START zetia 10mg  once daily DECREASE imdur to 15mg  daily for a week and then if no further chest pain, stop this medication   Labwork:  Fasting blood work in THREE MONTHS (May) to check cholesterol  Testing/Procedures:  NONE  Follow-Up:  Your physician wants you to follow-up in: ONE YEAR with Dr. Rennis GoldenHilty. You will receive a reminder letter in the mail two months in advance. If you don't receive a letter, please call our office to schedule the follow-up appointment.   If you need a refill on your cardiac medications before your next appointment, please call your pharmacy.  Any Other Special Instructions Will Be Listed Below (If Applicable).

## 2017-11-30 NOTE — Progress Notes (Signed)
OFFICE NOTE  Chief Complaint:  No complaints  Primary Care Physician: Benita StabileHall, John Z, MD  HPI:  Cheryl Dunn is a pleasant 62 year old female who is [place], [place], who unfortunately has a history of coronary artery disease and underwent a stent to the proximal LAD in 2001. Apparently this was difficult and the result was somewhat suboptimal. Less than one year later she was having chest pain while in cardiac rehabilitation and underwent a repeat cardiac catheterization. This demonstrated a significant bifurcation lesion at the ostial LAD and left circumflex. Based on these findings a coronary bypass was recommended. She'll LAF underwent two-vessel coronary artery bypass grafting in 2002 with a LIMA to the LAD and radial artery to the circumflex.  She has done well since that time.  Cheryl Dunn has dyslipidemia, mild hypothyroidism and mild hypertension-well controlled.  And she doesn't and she unfortunately had a complication with cardiac catheterization and had to have a patch repair of the right femoral artery. Currently she is asymptomatic and is interesting in starting care with a cardiologist in the area.  Cheryl Dunn returns today for follow-up. She feels quite well. She occasionally struggles with some sadness and has been intermittently smoking. She had quit successfully years ago around the time of her bypass surgery, but admits that she has been buying cigarettes and smoking. She has no real anginal symptoms and denies any worsening shortness of breath. She occasionally gets a cough which I feel is related to smoking.  I had the pleasure of seeing Cheryl Dunn back today in the office. Overall she is doing really well. She fortunately quit smoking this past fall and has been abstinent from cigarettes since then. She denies any chest pain or worsening shortness of breath. She has had some weight gain which is not unexpected with smoking cessation. She reported she had recent cholesterol work done  by primary care provider which is mildly elevated. She is currently only taking Crestor 5 mg daily instead of 10 mg due to some constipation. We may need to readjust this dosing based on her lab work which I will obtain. She has not needed to use any nitroglycerin.  11/09/2016  Cheryl Dunn returns today for follow-up. She has no complaints of last year although has been under significant stress. She's been traveling back and forth to [place] to care for her mother. Her weight is actually down from 163-157 pounds. She is more rarely taking Celebrex, only as needed. Recent lab work from her PCP the end of December indicated total cholesterol 219, HDL 71, triglycerides 129 and LDL 124. This is a marked increase from her last numbers. She reports that she is only taking half of her rosuvastatin (5 mg). She thought that the higher dose gave her "gas". She also is complaining of some right thigh discomfort. It seems to be more depressed when laying down and not necessarily with walking. She wondered if it was related to her prior patch angioplasty of her right femoral artery. I do not suspect she has any significant disease however it would be reasonable to reassess that with lower extremity arterial Dopplers.  11/30/2017  Cheryl Dunn was seen today in follow-up.  In the fall she underwent hernia surgery and had to stop her Plavix for 5 days prior to that.  She had no issues with that and her surgery went well.  She denies any chest pain or worsening shortness of breath.  She recently had labs from her primary care provider showed  total cholesterol 168, HDL 60, triglycerides 90, LDL-C 90, non-HDL 108.  Based on these findings she still not at goal LDL less than 70.  She has been compliant with the rosuvastatin 10 mg daily.  She has not tried higher doses but is concerned about side effects which she felt like she had on rosuvastatin, namely constipation.  This may have been related the fact she had a ventral hernia.  She  also had peripheral Dopplers of the fall which were normal.  PMHx:  Past Medical History:  Diagnosis Date  . Arthritis   . CAD (coronary artery disease)    s/p CABG & caths with stenting   . Diabetes (HCC) 08/2013  . GERD (gastroesophageal reflux disease)   . Hyperlipidemia   . Hypertension   . Hypothyroidism     Past Surgical History:  Procedure Laterality Date  . APPENDECTOMY    . CHOLECYSTECTOMY  1982  . CORONARY ANGIOPLASTY WITH STENT PLACEMENT  08/2000   stent  . CORONARY ANGIOPLASTY WITH STENT PLACEMENT  06/2009   stent  . CORONARY ARTERY BYPASS GRAFT  12/14/2000  . FEMORAL ARTERY REPAIR  2010   post-cath  . ORIF ANKLE FRACTURE Right   . TUBAL LIGATION  1984  . UMBILICAL HERNIA REPAIR N/A 10/22/2017   Procedure: OPEN EPIGASTRIC HERNIA REPAIR WITH MESH;  Surgeon: Lucretia Roers, MD;  Location: [facility];  Service: General;  Laterality: N/A;    FAMHx:  Family History  Problem Relation Age of Onset  . Hypertension Mother   . Diabetes Mother   . Hypertension Father   . Diabetes Father   . Heart attack Maternal Grandfather   . Hypertension Paternal Grandmother   . Liver disease Paternal Grandfather   . Hyperlipidemia Sister     SOCHx:   reports that she quit smoking about 13 years ago. Her smoking use included cigarettes. She has a 5.00 pack-year smoking history. she has never used smokeless tobacco. She reports that she does not drink alcohol or use drugs.  ALLERGIES:  Allergies  Allergen Reactions  . Amoxicillin Itching and Swelling    Throat closes up  Has patient had a PCN reaction causing immediate rash, facial/tongue/throat swelling, SOB or lightheadedness with hypotension: Yes Has patient had a PCN reaction causing severe rash involving mucus membranes or skin necrosis: No Has patient had a PCN reaction that required hospitalization: No Has patient had a PCN reaction occurring within the last 10 years: No  If all of the above answers are "NO", then may  proceed with Cephalosporin use.  . Codeine Diarrhea and Nausea Only  . Vicodin [Hydrocodone-Acetaminophen] Itching    ROS: Pertinent items noted in HPI and remainder of comprehensive ROS otherwise negative.  HOME MEDS: Current Outpatient Medications  Medication Sig Dispense Refill  . celecoxib (CELEBREX) 200 MG capsule Take 200 mg by mouth 2 (two) times daily as needed for mild pain or moderate pain.     Marland Kitchen clopidogrel (PLAVIX) 75 MG tablet Take 75 mg by mouth daily with breakfast.    . docusate sodium (COLACE) 100 MG capsule Take 1 capsule (100 mg total) by mouth 2 (two) times daily. 60 capsule 2  . FLUTICASONE PROPIONATE, NASAL, NA Place into the nose daily as needed.    Marland Kitchen glipiZIDE (GLUCOTROL) 5 MG tablet Take 5 mg by mouth daily before breakfast.    . isosorbide mononitrate (IMDUR) 30 MG 24 hr tablet Take 30 mg by mouth daily.    . metoprolol tartrate (LOPRESSOR)  25 MG tablet Take 25 mg by mouth 2 (two) times daily.    . nitroGLYCERIN (NITROSTAT) 0.4 MG SL tablet Place 1 tablet (0.4 mg total) under the tongue every 5 (five) minutes as needed for chest pain. 25 tablet 3  . ranitidine (ZANTAC) 75 MG tablet Take 75 mg by mouth 2 (two) times daily.    . rosuvastatin (CRESTOR) 10 MG tablet Take 10 mg by mouth daily.    Marland Kitchen SYNTHROID 150 MCG tablet Take 150 mcg by mouth daily before breakfast.      No current facility-administered medications for this visit.     LABS/IMAGING: No results found for this or any previous visit (from the past 48 hour(s)). No results found.  VITALS: BP 100/68   Pulse 81   Ht 5' (1.524 m)   Wt 160 lb 6.4 oz (72.8 kg)   BMI 31.33 kg/m   EXAM: General appearance: alert and no distress Neck: no carotid bruit and no JVD Lungs: clear to auscultation bilaterally Heart: regular rate and rhythm, S1, S2 normal, no murmur, click, rub or gallop Abdomen: soft, non-tender; bowel sounds normal; no masses,  no organomegaly Extremities: extremities normal,  atraumatic, no cyanosis or edema Pulses: 2+ and symmetric Skin: Skin color, texture, turgor normal. No rashes or lesions Neurologic: Grossly normal Psych: Pleasant, normal affect  EKG: Normal sinus at 81-personally reviewed  ASSESSMENT: 1. Coronary artery disease status post PCI to the LAD in 2001 2. Two-vessel CABG for bifurcation ostial LAD and circumflex disease in 2002 (LIMA to LAD, free radial to circumflex) 3. Patch angioplasty to the right common femoral artery for iatrogenic injury 4. Dyslipidemia 5. Hypertension 6. Diabetes type 2 7. Hypothyroidism 8. Quit smoking fall 2016  PLAN: 1.   Ms. Vitelli remains asymptomatic.  She denies any chest pain or worsening shortness of breath.  Her cholesterol is not yet at goal.  We talked about dietary modifications and possibly increasing her rosuvastatin to 40 mg versus adding ezetimibe.  To reduce her chance of side effects we will go ahead and try ezetimibe.  Hopefully she will reach her goal with a 20% reduction.  Repeat a lipid profile in 3 months.  I also advised her to consider decreasing her isosorbide from 30 to 15 mg for a week.  If this is tolerated she can wean off of that.  And she will need a refill of her nitroglycerin.   Follow-up with me annually or sooner as necessary.  Chrystie Nose, MD, [facility], FACP  [place]  [facility]  Medical Director of the [facility]  [facility] Diplomate of the American Board of Clinical Lipidology Attending Cardiologist  Direct Dial: [phone]  Fax: [phone]  Website:  [email or web address] Nicely [email or web address] 11/30/2017, 9:15 AM

## 2017-12-21 ENCOUNTER — Ambulatory Visit: Payer: Managed Care, Other (non HMO) | Admitting: General Surgery

## 2018-10-19 ENCOUNTER — Other Ambulatory Visit (HOSPITAL_COMMUNITY): Payer: Self-pay | Admitting: Internal Medicine

## 2018-10-19 DIAGNOSIS — Z1231 Encounter for screening mammogram for malignant neoplasm of breast: Secondary | ICD-10-CM

## 2018-10-19 DIAGNOSIS — Z780 Asymptomatic menopausal state: Secondary | ICD-10-CM

## 2018-10-31 ENCOUNTER — Encounter: Payer: Managed Care, Other (non HMO) | Admitting: Obstetrics & Gynecology

## 2018-11-08 ENCOUNTER — Ambulatory Visit: Payer: Managed Care, Other (non HMO) | Admitting: Obstetrics & Gynecology

## 2018-11-08 ENCOUNTER — Encounter: Payer: Self-pay | Admitting: Obstetrics & Gynecology

## 2018-11-08 VITALS — BP 136/91 | HR 106 | Ht 60.0 in | Wt 159.5 lb

## 2018-11-08 DIAGNOSIS — N814 Uterovaginal prolapse, unspecified: Secondary | ICD-10-CM

## 2018-11-08 DIAGNOSIS — N8110 Cystocele, unspecified: Secondary | ICD-10-CM

## 2018-11-08 NOTE — Progress Notes (Signed)
Chief Complaint  Patient presents with  . bladder has dropped      62 y.o. G2P2001 No LMP recorded. Patient is postmenopausal. The current method of family planning is tubal ligation.  Outpatient Encounter Medications as of 11/08/2018  Medication Sig Note  . celecoxib (CELEBREX) 200 MG capsule Take 200 mg by mouth 2 (two) times daily as needed for mild pain or moderate pain.    Marland Kitchen. clopidogrel (PLAVIX) 75 MG tablet Take 75 mg by mouth daily with breakfast. 10/22/2017: Restart 10/25/2017.  Marland Kitchen. glipiZIDE (GLUCOTROL) 5 MG tablet Take 5 mg by mouth daily before breakfast.   . guaiFENesin (MUCINEX PO) Take by mouth 2 (two) times daily as needed.   . metoprolol tartrate (LOPRESSOR) 25 MG tablet Take 25 mg by mouth 2 (two) times daily.   . nitroGLYCERIN (NITROSTAT) 0.4 MG SL tablet Place 1 tablet (0.4 mg total) under the tongue every 5 (five) minutes as needed for chest pain.   . ranitidine (ZANTAC) 75 MG tablet Take 75 mg by mouth 2 (two) times daily.   . rosuvastatin (CRESTOR) 10 MG tablet Take 10 mg by mouth daily.   Marland Kitchen. SYNTHROID 150 MCG tablet Take 150 mcg by mouth daily before breakfast.  11/09/2016: Received from: External Pharmacy  . [DISCONTINUED] Ezetimibe (ZETIA PO) Take by mouth. Takes 3 times a week   . [DISCONTINUED] ezetimibe (ZETIA) 10 MG tablet Take 1 tablet (10 mg total) by mouth daily.   . [DISCONTINUED] FLUTICASONE PROPIONATE, NASAL, NA Place into the nose daily as needed.   . [DISCONTINUED] isosorbide mononitrate (IMDUR) 30 MG 24 hr tablet Take 30 mg by mouth daily.    No facility-administered encounter medications on file as of 11/08/2018.     Subjective Pt referred for evaluation of "bladder dropped" worsening over the past several weeks + pressure Feels like can't empty bladder No urine loss No pain just pressure No bleeding Past Medical History:  Diagnosis Date  . Arthritis   . CAD (coronary artery disease)    s/p CABG & caths with stenting   . Diabetes (HCC)  08/2013  . GERD (gastroesophageal reflux disease)   . Hyperlipidemia   . Hypertension   . Hypothyroidism     Past Surgical History:  Procedure Laterality Date  . APPENDECTOMY    . CHOLECYSTECTOMY  1982  . CORONARY ANGIOPLASTY WITH STENT PLACEMENT  08/2000   stent  . CORONARY ANGIOPLASTY WITH STENT PLACEMENT  06/2009   stent  . CORONARY ARTERY BYPASS GRAFT  12/14/2000  . FEMORAL ARTERY REPAIR  2010   post-cath  . ORIF ANKLE FRACTURE Right   . TUBAL LIGATION  1984  . UMBILICAL HERNIA REPAIR N/A 10/22/2017   Procedure: OPEN EPIGASTRIC HERNIA REPAIR WITH MESH;  Surgeon: Lucretia RoersBridges, Lindsay C, MD;  Location: [facility];  Service: General;  Laterality: N/A;    OB History    Gravida  2   Para  2   Term  2   Preterm      AB      Living  1     SAB      TAB      Ectopic      Multiple      Live Births  2           Allergies  Allergen Reactions  . Amoxicillin Itching and Swelling    Throat closes up  Has patient had a PCN reaction causing immediate rash, facial/tongue/throat swelling, SOB or lightheadedness  with hypotension: Yes Has patient had a PCN reaction causing severe rash involving mucus membranes or skin necrosis: No Has patient had a PCN reaction that required hospitalization: No Has patient had a PCN reaction occurring within the last 10 years: No  If all of the above answers are "NO", then may proceed with Cephalosporin use.  . Codeine Diarrhea and Nausea Only  . Vicodin [Hydrocodone-Acetaminophen] Itching    Social History   Socioeconomic History  . Marital status: Married    Spouse name: Not on file  . Number of children: 1  . Years of education: Not on file  . Highest education level: Not on file  Occupational History  . 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Data processing manager, [employer]  Social Needs  . Financial resource strain: Not on file  . Food insecurity:    Worry: Not on file    Inability: Not on file  . Transportation needs:    Medical:  Not on file    Non-medical: Not on file  Tobacco Use  . Smoking status: Former Smoker    Packs/day: 0.25    Years: 20.00    Pack years: 5.00    Types: Cigarettes    Last attempt to quit: 10/18/2004    Years since quitting: 14.1  . Smokeless tobacco: Never Used  Substance and Sexual Activity  . Alcohol use: No    Alcohol/week: 0.0 standard drinks  . Drug use: No  . Sexual activity: Yes    Birth control/protection: Post-menopausal  Lifestyle  . Physical activity:    Days per week: Not on file    Minutes per session: Not on file  . Stress: Not on file  Relationships  . Social connections:    Talks on phone: Not on file    Gets together: Not on file    Attends religious service: Not on file    Active member of club or organization: Not on file    Attends meetings of clubs or organizations: Not on file    Relationship status: Not on file  Other Topics Concern  . Not on file  Social History Narrative  . Not on file    Family History  Problem Relation Age of Onset  . Hypertension Mother   . Diabetes Mother   . Hypertension Father   . Diabetes Father   . Heart attack Maternal Grandfather   . Hypertension Paternal Grandmother   . Liver disease Paternal Grandfather   . Hyperlipidemia Sister   . Heart attack Sister        bypass surgery  . Other Daughter        meningitis    Medications:       Current Outpatient Medications:  .  celecoxib (CELEBREX) 200 MG capsule, Take 200 mg by mouth 2 (two) times daily as needed for mild pain or moderate pain. , Disp: , Rfl:  .  clopidogrel (PLAVIX) 75 MG tablet, Take 75 mg by mouth daily with breakfast., Disp: , Rfl:  .  glipiZIDE (GLUCOTROL) 5 MG tablet, Take 5 mg by mouth daily before breakfast., Disp: , Rfl:  .  guaiFENesin (MUCINEX PO), Take by mouth 2 (two) times daily as needed., Disp: , Rfl:  .  metoprolol tartrate (LOPRESSOR) 25 MG tablet, Take 25 mg by mouth 2 (two) times daily., Disp: , Rfl:  .  nitroGLYCERIN (NITROSTAT)  0.4 MG SL tablet, Place 1 tablet (0.4 mg total) under the tongue every 5 (five) minutes as needed for chest  pain., Disp: 25 tablet, Rfl: 3 .  ranitidine (ZANTAC) 75 MG tablet, Take 75 mg by mouth 2 (two) times daily., Disp: , Rfl:  .  rosuvastatin (CRESTOR) 10 MG tablet, Take 10 mg by mouth daily., Disp: , Rfl:  .  SYNTHROID 150 MCG tablet, Take 150 mcg by mouth daily before breakfast. , Disp: , Rfl:  .  ezetimibe (ZETIA) 10 MG tablet, TAKE 1 TABLET DAILY, Disp: 90 tablet, Rfl: 0  Objective Blood pressure (!) 136/91, pulse (!) 106, height 5' (1.524 m), weight 159 lb 8 oz (72.3 kg).  General WDWN female NAD Vulva:  normal appearing vulva with no masses, tenderness or lesions, bladder past hymenal remanat Vagina:  Prolapse of bladder and uterus Cervix:  Grade III prolapse Uterus:  prolapse Adnexa: ovaries:present,  normal adnexa in size, nontender and no masses   Pertinent ROS No burning with urination, frequency or urgency No nausea, vomiting or diarrhea Nor fever chills or other constitutional symptoms   Labs or studies     Impression Diagnoses this Encounter::   ICD-10-CM   1. Cystocele with uterine prolapse N81.4   2. Baden-Walker grade 4 cystocele N81.10     Established relevant diagnosis(es):   Plan/Recommendations: No orders of the defined types were placed in this encounter.   Labs or Scans Ordered: No orders of the defined types were placed in this encounter.   Management:: >fit for milex ring with support pessary #5 >instructions given  Follow up Return in about 1 month (around 12/09/2018) for Follow up, with Dr Despina Hidden.        All questions were answered.

## 2018-11-13 ENCOUNTER — Other Ambulatory Visit: Payer: Self-pay | Admitting: Internal Medicine

## 2018-11-13 DIAGNOSIS — E785 Hyperlipidemia, unspecified: Secondary | ICD-10-CM

## 2018-12-05 ENCOUNTER — Encounter: Payer: Self-pay | Admitting: Obstetrics & Gynecology

## 2018-12-05 ENCOUNTER — Ambulatory Visit: Payer: Managed Care, Other (non HMO) | Admitting: Obstetrics & Gynecology

## 2018-12-05 VITALS — BP 135/86 | HR 110 | Ht 60.0 in | Wt 164.0 lb

## 2018-12-05 DIAGNOSIS — Z4689 Encounter for fitting and adjustment of other specified devices: Secondary | ICD-10-CM

## 2018-12-05 DIAGNOSIS — N8110 Cystocele, unspecified: Secondary | ICD-10-CM

## 2018-12-05 NOTE — Progress Notes (Signed)
Chief Complaint  Patient presents with  . Follow-up    on pessary    Blood pressure 135/86, pulse (!) 110, height 5' (1.524 m), weight 164 lb (74.4 kg).  Cheryl Dunn presents today for routine follow up related to her pessary.   She uses a Milex ring with support #5 She reports no vaginal discharge or vaginal bleeding.  Exam reveals no undue vaginal mucosal pressure of breakdown, no discharge and no vaginal bleeding.  The pessary is removed, cleaned and replaced without difficulty.    Cheryl Dunn will be sen back in 3 months for continued follow up.  Lazaro ArmsLuther H Jovani Flury, MD  12/05/2018 12:17 PM

## 2018-12-08 ENCOUNTER — Ambulatory Visit: Payer: Managed Care, Other (non HMO) | Admitting: Internal Medicine

## 2018-12-08 ENCOUNTER — Encounter: Payer: Self-pay | Admitting: Internal Medicine

## 2018-12-08 VITALS — BP 136/78 | HR 74 | Ht 60.0 in | Wt 162.6 lb

## 2018-12-08 DIAGNOSIS — Z951 Presence of aortocoronary bypass graft: Secondary | ICD-10-CM

## 2018-12-08 DIAGNOSIS — R000 Tachycardia, unspecified: Secondary | ICD-10-CM

## 2018-12-08 DIAGNOSIS — I10 Essential (primary) hypertension: Secondary | ICD-10-CM

## 2018-12-08 DIAGNOSIS — R0683 Snoring: Secondary | ICD-10-CM | POA: Diagnosis not present

## 2018-12-08 DIAGNOSIS — E785 Hyperlipidemia, unspecified: Secondary | ICD-10-CM | POA: Diagnosis not present

## 2018-12-08 NOTE — Patient Instructions (Signed)
Medication Instructions:  Continue current medications  If you need a refill on your cardiac medications before your next appointment, please call your pharmacy.  Labwork: Fasting Lipids in 3 months HERE IN OUR OFFICE AT [facility]  You will need to fast. DO NOT EAT OR DRINK PAST MIDNIGHT.     Take the provided lab slips with you to the lab for your blood draw.   When you have your labs (blood work) drawn today and your tests are completely normal, you will receive your results only by MyChart Message (if you have MyChart) -OR-  A paper copy in the mail.  If you have any lab test that is abnormal or we need to change your treatment, we will call you to review these results.  Testing/Procedures: Your physician has recommended that you have a sleep study. This test records several body functions during sleep, including: brain activity, eye movement, oxygen and carbon dioxide blood levels, heart rate and rhythm, breathing rate and rhythm, the flow of air through your mouth and nose, snoring, body muscle movements, and chest and belly movement.   Follow-Up: You will need a follow up appointment in 1 Year.  Please call our office 2 months in advance to schedule this appointment.  You may see Dr Rennis Golden or one of the following Advanced Practice Providers on your designated Care Team: Azalee Course, [place] . Micah Flesher, PA-C   At [facility], you and your health needs are our priority.  As part of our continuing mission to provide you with exceptional heart care, we have created designated Provider Care Teams.  These Care Teams include your primary Cardiologist (physician) and Advanced Practice Providers (APPs -  Physician Assistants and Nurse Practitioners) who all work together to provide you with the care you need, when you need it.   Thank you for choosing [facility] at [facility]!!

## 2018-12-08 NOTE — Progress Notes (Signed)
OFFICE NOTE  Chief Complaint:  Annual follow-up  Primary Care Physician: Benita Stabile, MD  HPI:  Cheryl Dunn is a pleasant 63 year old female who is from [place], [place], who unfortunately has a history of coronary artery disease and underwent a stent to the proximal LAD in 2001. Apparently this was difficult and the result was somewhat suboptimal. Less than one year later she was having chest pain while in cardiac rehabilitation and underwent a repeat cardiac catheterization. This demonstrated a significant bifurcation lesion at the ostial LAD and left circumflex. Based on these findings a coronary bypass was recommended. She'll LAF underwent two-vessel coronary artery bypass grafting in 2002 with a LIMA to the LAD and radial artery to the circumflex.  She has done well since that time.  Cheryl Dunn has dyslipidemia, mild hypothyroidism and mild hypertension-well controlled.  And she doesn't and she unfortunately had a complication with cardiac catheterization and had to have a patch repair of the right femoral artery. Currently she is asymptomatic and is interesting in starting care with a cardiologist in the area.  Cheryl Dunn returns today for follow-up. She feels quite well. She occasionally struggles with some sadness and has been intermittently smoking. She had quit successfully years ago around the time of her bypass surgery, but admits that she has been buying cigarettes and smoking. She has no real anginal symptoms and denies any worsening shortness of breath. She occasionally gets a cough which I feel is related to smoking.  I had the pleasure of seeing [name] back today in the office. Overall she is doing really well. She fortunately quit smoking this past fall and has been abstinent from cigarettes since then. She denies any chest pain or worsening shortness of breath. She has had some weight gain which is not unexpected with smoking cessation. She reported she had recent cholesterol work  done by primary care provider which is mildly elevated. She is currently only taking Crestor 5 mg daily instead of 10 mg due to some constipation. We may need to readjust this dosing based on her lab work which I will obtain. She has not needed to use any nitroglycerin.  11/09/2016  Cheryl Dunn returns today for follow-up. She has no complaints of last year although has been under significant stress. She's been traveling back and forth to [place] to care for her mother. Her weight is actually down from 163-157 pounds. She is more rarely taking Celebrex, only as needed. Recent lab work from her PCP the end of December indicated total cholesterol 219, HDL 71, triglycerides 129 and LDL 124. This is a marked increase from her last numbers. She reports that she is only taking half of her rosuvastatin (5 mg). She thought that the higher dose gave her "gas". She also is complaining of some right thigh discomfort. It seems to be more depressed when laying down and not necessarily with walking. She wondered if it was related to her prior patch angioplasty of her right femoral artery. I do not suspect she has any significant disease however it would be reasonable to reassess that with lower extremity arterial Dopplers.  11/30/2017  Cheryl Dunn was seen today in follow-up.  In the fall she underwent hernia surgery and had to stop her Plavix for 5 days prior to that.  She had no issues with that and her surgery went well.  She denies any chest pain or worsening shortness of breath.  She recently had labs from her primary care provider showed  total cholesterol 168, HDL 60, triglycerides 90, LDL-C 90, non-HDL 108.  Based on these findings she still not at goal LDL less than 70.  She has been compliant with the rosuvastatin 10 mg daily.  She has not tried higher doses but is concerned about side effects which she felt like she had on rosuvastatin, namely constipation.  This may have been related the fact she had a ventral hernia.   She also had peripheral Dopplers of the fall which were normal.  12/08/2018  Cheryl Dunn is seen today in follow-up.  Over the past year she is done well.  She denies any chest pain or worsening shortness of breath.  She did have lab work in December 2019 and her total cholesterol is 224, HDL 71 and triglycerides 108.  Her LDL was not reported.  We will need to repeat her lipid profile.  EKG shows sinus rhythm today.  She has been having daytime fatigue.  She reports she is told that she snores very loudly.  I had her fill out an Epworth sleepiness score today.  Her score was 14 concerning for possible undiagnosed obstructive sleep apnea.  Pressure is at goal.  PMHx:  Past Medical History:  Diagnosis Date  . Arthritis   . CAD (coronary artery disease)    s/p CABG & caths with stenting   . Diabetes (HCC) 08/2013  . GERD (gastroesophageal reflux disease)   . Hyperlipidemia   . Hypertension   . Hypothyroidism     Past Surgical History:  Procedure Laterality Date  . APPENDECTOMY    . CHOLECYSTECTOMY  1982  . CORONARY ANGIOPLASTY WITH STENT PLACEMENT  08/2000   stent  . CORONARY ANGIOPLASTY WITH STENT PLACEMENT  06/2009   stent  . CORONARY ARTERY BYPASS GRAFT  12/14/2000  . FEMORAL ARTERY REPAIR  2010   post-cath  . ORIF ANKLE FRACTURE Right   . TUBAL LIGATION  1984  . UMBILICAL HERNIA REPAIR N/A 10/22/2017   Procedure: OPEN EPIGASTRIC HERNIA REPAIR WITH MESH;  Surgeon: Lucretia RoersBridges, Lindsay C, MD;  Location: [facility];  Service: General;  Laterality: N/A;    FAMHx:  Family History  Problem Relation Age of Onset  . Hypertension Mother   . Diabetes Mother   . Hypertension Father   . Diabetes Father   . Heart attack Maternal Grandfather   . Hypertension Paternal Grandmother   . Liver disease Paternal Grandfather   . Hyperlipidemia Sister   . Heart attack Sister        bypass surgery  . Other Daughter        meningitis    SOCHx:   reports that she quit smoking about 14 years ago. Her  smoking use included cigarettes. She has a 5.00 pack-year smoking history. She has never used smokeless tobacco. She reports that she does not drink alcohol or use drugs.  ALLERGIES:  Allergies  Allergen Reactions  . Amoxicillin Itching and Swelling    Throat closes up  Has patient had a PCN reaction causing immediate rash, facial/tongue/throat swelling, SOB or lightheadedness with hypotension: Yes Has patient had a PCN reaction causing severe rash involving mucus membranes or skin necrosis: No Has patient had a PCN reaction that required hospitalization: No Has patient had a PCN reaction occurring within the last 10 years: No  If all of the above answers are "NO", then may proceed with Cephalosporin use.  . Codeine Diarrhea and Nausea Only  . Vicodin [Hydrocodone-Acetaminophen] Itching    ROS: Pertinent items  noted in HPI and remainder of comprehensive ROS otherwise negative.  HOME MEDS: Current Outpatient Medications  Medication Sig Dispense Refill  . acetaminophen (TYLENOL) 500 MG tablet Take 500 mg by mouth as needed.    . celecoxib (CELEBREX) 200 MG capsule Take 200 mg by mouth 2 (two) times daily as needed for mild pain or moderate pain.     Marland Kitchen. clopidogrel (PLAVIX) 75 MG tablet Take 75 mg by mouth daily with breakfast.    . ezetimibe (ZETIA) 10 MG tablet TAKE 1 TABLET DAILY 90 tablet 0  . glipiZIDE (GLUCOTROL) 5 MG tablet Take 5 mg by mouth daily before breakfast.    . guaiFENesin (MUCINEX PO) Take by mouth 2 (two) times daily as needed.    . metoprolol tartrate (LOPRESSOR) 25 MG tablet Take 25 mg by mouth 2 (two) times daily.    . nitroGLYCERIN (NITROSTAT) 0.4 MG SL tablet Place 1 tablet (0.4 mg total) under the tongue every 5 (five) minutes as needed for chest pain. 25 tablet 3  . ranitidine (ZANTAC) 75 MG tablet Take 75 mg by mouth 2 (two) times daily.    . rosuvastatin (CRESTOR) 10 MG tablet Take 10 mg by mouth daily.    Marland Kitchen. SYNTHROID 150 MCG tablet Take 150 mcg by mouth daily  before breakfast.      No current facility-administered medications for this visit.     LABS/IMAGING: No results found for this or any previous visit (from the past 48 hour(s)). No results found.  VITALS: BP 136/78   Pulse 74   Ht 5' (1.524 m)   Wt 162 lb 9.6 oz (73.8 kg)   BMI 31.76 kg/m   EXAM: General appearance: alert and no distress Neck: no carotid bruit and no JVD Lungs: clear to auscultation bilaterally Heart: regular rate and rhythm, S1, S2 normal, no murmur, click, rub or gallop Abdomen: soft, non-tender; bowel sounds normal; no masses,  no organomegaly Extremities: extremities normal, atraumatic, no cyanosis or edema Pulses: 2+ and symmetric Skin: Skin color, texture, turgor normal. No rashes or lesions Neurologic: Grossly normal Psych: Pleasant, normal affect  EKG: Normal sinus rhythm 75-personally reviewed  ASSESSMENT: 1. Coronary artery disease status post PCI to the LAD in 2001 2. Two-vessel CABG for bifurcation ostial LAD and circumflex disease in 2002 (LIMA to LAD, free radial to circumflex) 3. Patch angioplasty to the right common femoral artery for iatrogenic injury 4. Dyslipidemia 5. Hypertension 6. Diabetes type 2 7. Hypothyroidism 8. Quit smoking fall 2016  PLAN: 1.   Ms. Clide Dunn has had fatigue, increased daytime sleepiness, nonrestorative sleep, loud snoring, obesity and other risk factors for sleep apnea.  I would like for her to undergo a sleep study.  Her EPWSS is 17.  She denies any new coronary symptoms.  Her blood pressure is at goal.  Her cholesterol may be still above target.  We will repeat a lipid profile with goal LDL at less than 70.  Plan follow-up with me afterwards.  Chrystie NoseKenneth C. Everitt Wenner, MD, [facility] RoomFACC, FACP  [place]  [facility]  Medical Director of the [facility] Lipid Disorders &  Cardiovascular Risk Reduction [facility] Diplomate of the American Board of Clinical Lipidology Attending Cardiologist  Direct Dial: [phone]  Fax:  [phone]  Website:  [email or web address]  Tiarra Anastacio C Akirah Storck 12/08/2018, 8:46 AM

## 2018-12-09 ENCOUNTER — Ambulatory Visit: Payer: Managed Care, Other (non HMO) | Admitting: Obstetrics & Gynecology

## 2018-12-09 ENCOUNTER — Telehealth: Payer: Self-pay | Admitting: *Deleted

## 2018-12-09 NOTE — Telephone Encounter (Signed)
-----   Message from Silver Huguenin sent at 12/08/2018  9:15 AM EST ----- Regarding: sleep study Please precert and schedule.

## 2018-12-09 NOTE — Telephone Encounter (Signed)
PA submitted to [facility] via web portal for sleep study.

## 2018-12-16 ENCOUNTER — Other Ambulatory Visit: Payer: Self-pay | Admitting: Internal Medicine

## 2018-12-16 DIAGNOSIS — R000 Tachycardia, unspecified: Secondary | ICD-10-CM

## 2018-12-16 DIAGNOSIS — R0683 Snoring: Secondary | ICD-10-CM

## 2018-12-16 DIAGNOSIS — I10 Essential (primary) hypertension: Secondary | ICD-10-CM

## 2018-12-22 ENCOUNTER — Telehealth: Payer: Self-pay | Admitting: *Deleted

## 2018-12-22 NOTE — Telephone Encounter (Signed)
Left message to return a call- sleep study appointment details. 

## 2019-02-14 ENCOUNTER — Other Ambulatory Visit: Payer: Self-pay | Admitting: Internal Medicine

## 2019-02-14 DIAGNOSIS — E785 Hyperlipidemia, unspecified: Secondary | ICD-10-CM

## 2019-03-06 ENCOUNTER — Ambulatory Visit: Payer: Self-pay | Admitting: Obstetrics & Gynecology

## 2019-03-07 ENCOUNTER — Ambulatory Visit: Payer: Managed Care, Other (non HMO) | Admitting: Obstetrics & Gynecology

## 2019-03-13 ENCOUNTER — Telehealth: Payer: Self-pay | Admitting: *Deleted

## 2019-03-13 NOTE — Telephone Encounter (Signed)
Attempted to call pt with restrictions was unable to leave message.

## 2019-03-14 ENCOUNTER — Ambulatory Visit: Payer: Managed Care, Other (non HMO) | Admitting: Obstetrics & Gynecology

## 2019-03-14 ENCOUNTER — Encounter: Payer: Self-pay | Admitting: Obstetrics & Gynecology

## 2019-03-14 ENCOUNTER — Other Ambulatory Visit: Payer: Self-pay

## 2019-03-14 VITALS — BP 133/78 | HR 65 | Ht 60.0 in | Wt 162.0 lb

## 2019-03-14 DIAGNOSIS — Z4689 Encounter for fitting and adjustment of other specified devices: Secondary | ICD-10-CM | POA: Diagnosis not present

## 2019-03-14 DIAGNOSIS — N814 Uterovaginal prolapse, unspecified: Secondary | ICD-10-CM

## 2019-03-14 MED ORDER — METRONIDAZOLE 0.75 % VA GEL: VAGINAL | 3 refills | Status: DC

## 2019-03-14 NOTE — Progress Notes (Signed)
Chief Complaint  Patient presents with  . Pessary Check    Blood pressure 133/78, pulse 65, height 5' (1.524 m), weight 162 lb (73.5 kg).  Cheryl Dunn presents today for routine follow up related to her pessary.   She uses a milex ring with support #5 She reports little vaginal discharge or vaginal bleeding.  Exam reveals no undue vaginal mucosal pressure of breakdown, no discharge and no vaginal bleeding.  The pessary is removed, cleaned and replaced without difficulty.     Pessary maintenance - overalll doing very well, some occasional discharge no bleeding, very satisfied with the result  Cystocele with uterine prolapse   Cheryl Dunn will be sen back in 3 months for continued follow up.  Meds ordered this encounter  Medications  . metroNIDAZOLE (METROGEL VAGINAL) 0.75 % vaginal gel    Sig: Nightly x 5 nights    Dispense:  70 g    Refill:  3     Lazaro Arms, MD  03/14/2019 10:42 AM

## 2019-06-15 ENCOUNTER — Ambulatory Visit: Payer: Managed Care, Other (non HMO) | Admitting: Obstetrics & Gynecology

## 2019-07-11 ENCOUNTER — Ambulatory Visit: Payer: Managed Care, Other (non HMO) | Admitting: Obstetrics & Gynecology

## 2019-07-24 ENCOUNTER — Ambulatory Visit (HOSPITAL_COMMUNITY)
Admission: RE | Admit: 2019-07-24 | Discharge: 2019-07-24 | Disposition: A | Discharge status: Home / Self Care | Payer: Managed Care, Other (non HMO) | Source: Ambulatory Visit | Attending: Adult Health Nurse Practitioner | Admitting: Adult Health Nurse Practitioner

## 2019-07-24 ENCOUNTER — Other Ambulatory Visit: Payer: Self-pay

## 2019-07-24 ENCOUNTER — Other Ambulatory Visit (HOSPITAL_COMMUNITY): Payer: Self-pay | Admitting: Adult Health Nurse Practitioner

## 2019-07-24 DIAGNOSIS — R05 Cough: Secondary | ICD-10-CM | POA: Insufficient documentation

## 2019-07-24 DIAGNOSIS — R059 Cough, unspecified: Secondary | ICD-10-CM

## 2019-08-02 ENCOUNTER — Telehealth: Payer: Self-pay | Admitting: Obstetrics & Gynecology

## 2019-08-02 NOTE — Telephone Encounter (Signed)
Called patient regarding appointment and the following message was left: ° ° °We have you scheduled for an upcoming appointment at our office. At this time, patients are encouraged to come alone to their visits whenever possible, however, a support person, over age 61, may accompany you to your appointment if assistance is needed for safety or care concerns. Otherwise, support persons should remain outside until the visit is complete.  ° °We ask if you have had any exposure to anyone suspected or confirmed of having COVID-19 or if you are experiencing any of the following, to call and reschedule your appointment: fever, cough, shortness of breath, muscle pain, diarrhea, rash, vomiting, abdominal pain, red eye, weakness, bruising, bleeding, joint pain, or a severe headache.  ° °Please know we will ask you these questions or similar questions when you arrive for your appointment and again it’s how we are keeping everyone safe.   ° °Also,to keep you safe, please use the provided hand sanitizer when you enter the office. We are asking everyone in the office to wear a mask to help prevent the spread of °germs. If you have a mask of your own, please wear it to your appointment, if not, we are happy to provide one for you. ° °Thank you for understanding and your cooperation.  ° ° °[facility] Staff ° ° ° ° ° °

## 2019-08-03 ENCOUNTER — Other Ambulatory Visit: Payer: Self-pay

## 2019-08-03 ENCOUNTER — Ambulatory Visit: Payer: Managed Care, Other (non HMO) | Admitting: Obstetrics & Gynecology

## 2019-08-03 ENCOUNTER — Encounter: Payer: Self-pay | Admitting: Obstetrics & Gynecology

## 2019-08-03 VITALS — BP 123/83 | HR 70 | Ht 60.0 in | Wt 156.5 lb

## 2019-08-03 DIAGNOSIS — Z4689 Encounter for fitting and adjustment of other specified devices: Secondary | ICD-10-CM

## 2019-08-03 DIAGNOSIS — N814 Uterovaginal prolapse, unspecified: Secondary | ICD-10-CM | POA: Diagnosis not present

## 2019-08-03 NOTE — Progress Notes (Signed)
Chief Complaint  Patient presents with  . Pessary cleaning    Blood pressure 123/83, pulse 70, height 5' (1.524 m), weight 156 lb 8 oz (71 kg).  Cheryl Dunn presents today for routine follow up related to her pessary.   She uses a Milex ring with support #5 She reports no vaginal discharge or vaginal bleeding.  Exam reveals no undue vaginal mucosal pressure of breakdown, no discharge and no vaginal bleeding.  The pessary is removed, cleaned and replaced without difficulty.    Cheryl Dunn will be sen back in 4 months for continued follow up.  Florian Buff, MD  08/03/2019 9:20 AM

## 2019-11-30 ENCOUNTER — Telehealth: Payer: Self-pay | Admitting: Obstetrics & Gynecology

## 2019-11-30 NOTE — Telephone Encounter (Signed)

## 2019-12-04 ENCOUNTER — Ambulatory Visit: Payer: Managed Care, Other (non HMO) | Admitting: Obstetrics & Gynecology

## 2019-12-28 ENCOUNTER — Telehealth: Payer: Self-pay | Admitting: Obstetrics & Gynecology

## 2019-12-28 NOTE — Telephone Encounter (Signed)

## 2020-01-01 ENCOUNTER — Ambulatory Visit: Payer: Self-pay | Admitting: Obstetrics & Gynecology

## 2020-01-15 ENCOUNTER — Telehealth: Payer: Self-pay | Admitting: Obstetrics & Gynecology

## 2020-01-15 NOTE — Telephone Encounter (Signed)

## 2020-01-16 ENCOUNTER — Encounter: Payer: Self-pay | Admitting: Obstetrics & Gynecology

## 2020-01-16 ENCOUNTER — Other Ambulatory Visit: Payer: Self-pay

## 2020-01-16 ENCOUNTER — Ambulatory Visit (INDEPENDENT_AMBULATORY_CARE_PROVIDER_SITE_OTHER): Payer: BC Managed Care – PPO | Admitting: Obstetrics & Gynecology

## 2020-01-16 VITALS — BP 141/89 | HR 88 | Ht 60.0 in | Wt 161.0 lb

## 2020-01-16 DIAGNOSIS — Z4689 Encounter for fitting and adjustment of other specified devices: Secondary | ICD-10-CM

## 2020-01-16 DIAGNOSIS — N814 Uterovaginal prolapse, unspecified: Secondary | ICD-10-CM | POA: Diagnosis not present

## 2020-01-16 NOTE — Progress Notes (Signed)
Chief Complaint  Patient presents with  . Pessary Check    Blood pressure (!) 141/89, pulse 88, height 5' (1.524 m), weight 161 lb (73 kg).  Cheryl Dunn presents today for routine follow up related to her pessary.   She uses a Milex ring with support #5 She reports little vaginal discharge and no vaginal bleeding.  Exam reveals no undue vaginal mucosal pressure of breakdown, no discharge and no vaginal bleeding.  The pessary is removed, cleaned and replaced without difficulty.    [name] will be sen back in 4 months for continued follow up.  Lazaro Arms, MD  01/16/2020 4:17 PM

## 2020-01-18 DIAGNOSIS — E119 Type 2 diabetes mellitus without complications: Secondary | ICD-10-CM | POA: Diagnosis not present

## 2020-01-18 DIAGNOSIS — E039 Hypothyroidism, unspecified: Secondary | ICD-10-CM | POA: Diagnosis not present

## 2020-01-18 DIAGNOSIS — E782 Mixed hyperlipidemia: Secondary | ICD-10-CM | POA: Diagnosis not present

## 2020-01-18 DIAGNOSIS — I10 Essential (primary) hypertension: Secondary | ICD-10-CM | POA: Diagnosis not present

## 2020-01-24 DIAGNOSIS — M542 Cervicalgia: Secondary | ICD-10-CM | POA: Diagnosis not present

## 2020-01-24 DIAGNOSIS — M9902 Segmental and somatic dysfunction of thoracic region: Secondary | ICD-10-CM | POA: Diagnosis not present

## 2020-01-24 DIAGNOSIS — G44219 Episodic tension-type headache, not intractable: Secondary | ICD-10-CM | POA: Diagnosis not present

## 2020-01-24 DIAGNOSIS — M9901 Segmental and somatic dysfunction of cervical region: Secondary | ICD-10-CM | POA: Diagnosis not present

## 2020-01-31 ENCOUNTER — Other Ambulatory Visit (HOSPITAL_COMMUNITY): Payer: Self-pay | Admitting: Internal Medicine

## 2020-01-31 DIAGNOSIS — Z13820 Encounter for screening for osteoporosis: Secondary | ICD-10-CM

## 2020-01-31 DIAGNOSIS — I10 Essential (primary) hypertension: Secondary | ICD-10-CM | POA: Diagnosis not present

## 2020-01-31 DIAGNOSIS — M542 Cervicalgia: Secondary | ICD-10-CM | POA: Diagnosis not present

## 2020-01-31 DIAGNOSIS — E039 Hypothyroidism, unspecified: Secondary | ICD-10-CM | POA: Diagnosis not present

## 2020-01-31 DIAGNOSIS — Z1231 Encounter for screening mammogram for malignant neoplasm of breast: Secondary | ICD-10-CM

## 2020-01-31 DIAGNOSIS — G44219 Episodic tension-type headache, not intractable: Secondary | ICD-10-CM | POA: Diagnosis not present

## 2020-01-31 DIAGNOSIS — I2510 Atherosclerotic heart disease of native coronary artery without angina pectoris: Secondary | ICD-10-CM | POA: Diagnosis not present

## 2020-01-31 DIAGNOSIS — M9902 Segmental and somatic dysfunction of thoracic region: Secondary | ICD-10-CM | POA: Diagnosis not present

## 2020-01-31 DIAGNOSIS — E782 Mixed hyperlipidemia: Secondary | ICD-10-CM | POA: Diagnosis not present

## 2020-01-31 DIAGNOSIS — E119 Type 2 diabetes mellitus without complications: Secondary | ICD-10-CM | POA: Diagnosis not present

## 2020-01-31 DIAGNOSIS — M9901 Segmental and somatic dysfunction of cervical region: Secondary | ICD-10-CM | POA: Diagnosis not present

## 2020-02-15 DIAGNOSIS — G44219 Episodic tension-type headache, not intractable: Secondary | ICD-10-CM | POA: Diagnosis not present

## 2020-02-15 DIAGNOSIS — M9902 Segmental and somatic dysfunction of thoracic region: Secondary | ICD-10-CM | POA: Diagnosis not present

## 2020-02-15 DIAGNOSIS — M9901 Segmental and somatic dysfunction of cervical region: Secondary | ICD-10-CM | POA: Diagnosis not present

## 2020-02-15 DIAGNOSIS — M542 Cervicalgia: Secondary | ICD-10-CM | POA: Diagnosis not present

## 2020-02-21 DIAGNOSIS — M542 Cervicalgia: Secondary | ICD-10-CM | POA: Diagnosis not present

## 2020-02-21 DIAGNOSIS — M9901 Segmental and somatic dysfunction of cervical region: Secondary | ICD-10-CM | POA: Diagnosis not present

## 2020-02-21 DIAGNOSIS — M9902 Segmental and somatic dysfunction of thoracic region: Secondary | ICD-10-CM | POA: Diagnosis not present

## 2020-02-21 DIAGNOSIS — G44219 Episodic tension-type headache, not intractable: Secondary | ICD-10-CM | POA: Diagnosis not present

## 2020-02-28 DIAGNOSIS — M9902 Segmental and somatic dysfunction of thoracic region: Secondary | ICD-10-CM | POA: Diagnosis not present

## 2020-02-28 DIAGNOSIS — M9901 Segmental and somatic dysfunction of cervical region: Secondary | ICD-10-CM | POA: Diagnosis not present

## 2020-02-28 DIAGNOSIS — L570 Actinic keratosis: Secondary | ICD-10-CM | POA: Diagnosis not present

## 2020-02-28 DIAGNOSIS — M25541 Pain in joints of right hand: Secondary | ICD-10-CM | POA: Diagnosis not present

## 2020-02-28 DIAGNOSIS — E1169 Type 2 diabetes mellitus with other specified complication: Secondary | ICD-10-CM | POA: Diagnosis not present

## 2020-02-28 DIAGNOSIS — M25542 Pain in joints of left hand: Secondary | ICD-10-CM | POA: Diagnosis not present

## 2020-02-28 DIAGNOSIS — M542 Cervicalgia: Secondary | ICD-10-CM | POA: Diagnosis not present

## 2020-02-28 DIAGNOSIS — G44219 Episodic tension-type headache, not intractable: Secondary | ICD-10-CM | POA: Diagnosis not present

## 2020-04-17 ENCOUNTER — Ambulatory Visit (HOSPITAL_COMMUNITY): Payer: BC Managed Care – PPO

## 2020-04-17 ENCOUNTER — Other Ambulatory Visit (HOSPITAL_COMMUNITY): Payer: BC Managed Care – PPO

## 2020-05-15 ENCOUNTER — Ambulatory Visit (HOSPITAL_COMMUNITY)
Admission: RE | Admit: 2020-05-15 | Discharge: 2020-05-15 | Disposition: A | Discharge status: Home / Self Care | Payer: BC Managed Care – PPO | Source: Ambulatory Visit | Attending: Internal Medicine | Admitting: Internal Medicine

## 2020-05-15 ENCOUNTER — Other Ambulatory Visit: Payer: Self-pay

## 2020-05-15 DIAGNOSIS — Z1231 Encounter for screening mammogram for malignant neoplasm of breast: Secondary | ICD-10-CM | POA: Insufficient documentation

## 2020-05-15 DIAGNOSIS — Z13820 Encounter for screening for osteoporosis: Secondary | ICD-10-CM | POA: Diagnosis present

## 2020-05-16 ENCOUNTER — Ambulatory Visit: Payer: BC Managed Care – PPO | Admitting: Obstetrics & Gynecology

## 2020-05-21 ENCOUNTER — Other Ambulatory Visit (HOSPITAL_COMMUNITY): Payer: Self-pay | Admitting: Internal Medicine

## 2020-05-21 ENCOUNTER — Inpatient Hospital Stay
Admission: RE | Admit: 2020-05-21 | Discharge: 2020-05-21 | Disposition: A | Discharge status: Home / Self Care | Payer: Self-pay | Source: Ambulatory Visit | Attending: Internal Medicine | Admitting: Internal Medicine

## 2020-05-21 DIAGNOSIS — R928 Other abnormal and inconclusive findings on diagnostic imaging of breast: Secondary | ICD-10-CM

## 2020-06-11 ENCOUNTER — Encounter (HOSPITAL_COMMUNITY): Payer: BC Managed Care – PPO

## 2020-06-11 ENCOUNTER — Other Ambulatory Visit (HOSPITAL_COMMUNITY): Payer: BC Managed Care – PPO

## 2020-07-02 ENCOUNTER — Ambulatory Visit (INDEPENDENT_AMBULATORY_CARE_PROVIDER_SITE_OTHER): Payer: BC Managed Care – PPO | Admitting: Obstetrics & Gynecology

## 2020-07-02 ENCOUNTER — Encounter: Payer: Self-pay | Admitting: Obstetrics & Gynecology

## 2020-07-02 VITALS — BP 127/81 | HR 77 | Ht 60.0 in | Wt 160.0 lb

## 2020-07-02 DIAGNOSIS — N814 Uterovaginal prolapse, unspecified: Secondary | ICD-10-CM

## 2020-07-02 DIAGNOSIS — Z4689 Encounter for fitting and adjustment of other specified devices: Secondary | ICD-10-CM | POA: Diagnosis not present

## 2020-07-02 NOTE — Progress Notes (Signed)
Chief Complaint  Patient presents with  . Pessary Check    Blood pressure 127/81, pulse 77, height 5' (1.524 m), weight 160 lb (72.6 kg).  Cheryl Dunn presents today for routine follow up related to her pessary.   She uses a Milex ring with support #5 She reports no vaginal discharge or vaginal bleeding.  Exam reveals no undue vaginal mucosal pressure of breakdown, no discharge and no vaginal bleeding.  The pessary is removed, cleaned and replaced without difficulty.      ICD-10-CM   1. Pessary maintenance, Milex ring with support #5, OF 11/2018  Z46.89   2. Cystocele with uterine prolapse  N81.4      Cheryl Dunn will be sen back in 4 months for continued follow up.  Cheryl Arms, MD  07/02/2020 9:23 AM

## 2020-07-27 ENCOUNTER — Other Ambulatory Visit: Payer: Self-pay | Admitting: Internal Medicine

## 2020-07-27 ENCOUNTER — Other Ambulatory Visit: Payer: BC Managed Care – PPO

## 2020-07-27 DIAGNOSIS — Z20822 Contact with and (suspected) exposure to covid-19: Secondary | ICD-10-CM

## 2020-07-29 LAB — NOVEL CORONAVIRUS, NAA: SARS-CoV-2, NAA: NOT DETECTED

## 2020-07-29 LAB — SARS-COV-2, NAA 2 DAY TAT

## 2020-10-29 ENCOUNTER — Ambulatory Visit: Payer: BC Managed Care – PPO | Admitting: Obstetrics & Gynecology

## 2020-11-14 ENCOUNTER — Ambulatory Visit: Payer: BC Managed Care – PPO | Admitting: Internal Medicine

## 2020-12-19 ENCOUNTER — Ambulatory Visit: Payer: BC Managed Care – PPO | Admitting: Obstetrics & Gynecology

## 2021-01-09 ENCOUNTER — Ambulatory Visit (INDEPENDENT_AMBULATORY_CARE_PROVIDER_SITE_OTHER): Payer: BC Managed Care – PPO | Admitting: Internal Medicine

## 2021-01-09 ENCOUNTER — Other Ambulatory Visit: Payer: Self-pay

## 2021-01-09 ENCOUNTER — Encounter: Payer: Self-pay | Admitting: Internal Medicine

## 2021-01-09 VITALS — BP 130/80 | HR 100 | Ht 60.0 in | Wt 156.0 lb

## 2021-01-09 DIAGNOSIS — R0683 Snoring: Secondary | ICD-10-CM

## 2021-01-09 DIAGNOSIS — Z951 Presence of aortocoronary bypass graft: Secondary | ICD-10-CM

## 2021-01-09 DIAGNOSIS — E785 Hyperlipidemia, unspecified: Secondary | ICD-10-CM

## 2021-01-09 DIAGNOSIS — Z9189 Other specified personal risk factors, not elsewhere classified: Secondary | ICD-10-CM

## 2021-01-09 NOTE — Progress Notes (Signed)
OFFICE NOTE  Chief Complaint:  Annual follow-up  Primary Care Physician: Benita Stabile, MD  HPI:  Cheryl Dunn is a pleasant 65 year old female who is from [place], [place], who unfortunately has a history of coronary artery disease and underwent a stent to the proximal LAD in 2001. Apparently this was difficult and the result was somewhat suboptimal. Less than one year later she was having chest pain while in cardiac rehabilitation and underwent a repeat cardiac catheterization. This demonstrated a significant bifurcation lesion at the ostial LAD and left circumflex. Based on these findings a coronary bypass was recommended. She'll LAF underwent two-vessel coronary artery bypass grafting in 2002 with a LIMA to the LAD and radial artery to the circumflex.  She has done well since that time.  Cheryl Dunn has dyslipidemia, mild hypothyroidism and mild hypertension-well controlled.  And she doesn't and she unfortunately had a complication with cardiac catheterization and had to have a patch repair of the right femoral artery. Currently she is asymptomatic and is interesting in starting care with a cardiologist in the area.  Cheryl Dunn returns today for follow-up. She feels quite well. She occasionally struggles with some sadness and has been intermittently smoking. She had quit successfully years ago around the time of her bypass surgery, but admits that she has been buying cigarettes and smoking. She has no real anginal symptoms and denies any worsening shortness of breath. She occasionally gets a cough which I feel is related to smoking.  I had the pleasure of seeing Cheryl Dunn back today in the office. Overall she is doing really well. She fortunately quit smoking this past fall and has been abstinent from cigarettes since then. She denies any chest pain or worsening shortness of breath. She has had some weight gain which is not unexpected with smoking cessation. She reported she had recent cholesterol work  done by primary care provider which is mildly elevated. She is currently only taking Crestor 5 mg daily instead of 10 mg due to some constipation. We may need to readjust this dosing based on her lab work which I will obtain. She has not needed to use any nitroglycerin.  11/09/2016  Cheryl Dunn returns today for follow-up. She has no complaints of last year although has been under significant stress. She's been traveling back and forth to [place] to care for her mother. Her weight is actually down from 163-157 pounds. She is more rarely taking Celebrex, only as needed. Recent lab work from her PCP the end of December indicated total cholesterol 219, HDL 71, triglycerides 129 and LDL 124. This is a marked increase from her last numbers. She reports that she is only taking half of her rosuvastatin (5 mg). She thought that the higher dose gave her "gas". She also is complaining of some right thigh discomfort. It seems to be more depressed when laying down and not necessarily with walking. She wondered if it was related to her prior patch angioplasty of her right femoral artery. I do not suspect she has any significant disease however it would be reasonable to reassess that with lower extremity arterial Dopplers.  11/30/2017  Cheryl Dunn was seen today in follow-up.  In the fall she underwent hernia surgery and had to stop her Plavix for 5 days prior to that.  She had no issues with that and her surgery went well.  She denies any chest pain or worsening shortness of breath.  She recently had labs from her primary care provider showed  total cholesterol 168, HDL 60, triglycerides 90, LDL-C 90, non-HDL 108.  Based on these findings she still not at goal LDL less than 70.  She has been compliant with the rosuvastatin 10 mg daily.  She has not tried higher doses but is concerned about side effects which she felt like she had on rosuvastatin, namely constipation.  This may have been related the fact she had a ventral hernia.   She also had peripheral Dopplers of the fall which were normal.  12/08/2018  Cheryl Dunn is seen today in follow-up.  Over the past year she is done well.  She denies any chest pain or worsening shortness of breath.  She did have lab work in December 2019 and her total cholesterol is 224, HDL 71 and triglycerides 108.  Her LDL was not reported.  We will need to repeat her lipid profile.  EKG shows sinus rhythm today.  She has been having daytime fatigue.  She reports she is told that she snores very loudly.  I had her fill out an Epworth sleepiness score today.  Her score was 14 concerning for possible undiagnosed obstructive sleep apnea.  Pressure is at goal.  01/09/2021  Cheryl Dunn returns today for follow-up.  This is an annual visit for.  She continues to be asymptomatic.  She gets some rare shortness of breath.  Her PCP had switched her from glipizide to Metformin 500 mg extended release twice a day.  She also remains on rosuvastatin 10 mg daily.  Her cholesterol actually had crept up a little last year in September.  Total was at just over 205.  She is due for repeat lipids.  EKG today shows sinus rhythm with nonspecific ST and T wave changes.  PMHx:  Past Medical History:  Diagnosis Date  . Arthritis   . CAD (coronary artery disease)    s/p CABG & caths with stenting   . Diabetes (HCC) 08/2013  . GERD (gastroesophageal reflux disease)   . Hyperlipidemia   . Hypertension   . Hypothyroidism     Past Surgical History:  Procedure Laterality Date  . APPENDECTOMY    . CHOLECYSTECTOMY  1982  . CORONARY ANGIOPLASTY WITH STENT PLACEMENT  08/2000   stent  . CORONARY ANGIOPLASTY WITH STENT PLACEMENT  06/2009   stent  . CORONARY ARTERY BYPASS GRAFT  12/14/2000  . FEMORAL ARTERY REPAIR  2010   post-cath  . ORIF ANKLE FRACTURE Right   . TUBAL LIGATION  1984  . UMBILICAL HERNIA REPAIR N/A 10/22/2017   Procedure: OPEN EPIGASTRIC HERNIA REPAIR WITH MESH;  Surgeon: Lucretia Roers, MD;   Location: [facility];  Service: General;  Laterality: N/A;    FAMHx:  Family History  Problem Relation Age of Onset  . Hypertension Mother   . Diabetes Mother   . Hypertension Father   . Diabetes Father   . Heart attack Maternal Grandfather   . Hypertension Paternal Grandmother   . Liver disease Paternal Grandfather   . Hyperlipidemia Sister   . Heart attack Sister        bypass surgery  . Other Daughter        meningitis    SOCHx:   reports that she quit smoking about 16 years ago. Her smoking use included cigarettes. She has a 5.00 pack-year smoking history. She has never used smokeless tobacco. She reports that she does not drink alcohol and does not use drugs.  ALLERGIES:  Allergies  Allergen Reactions  . Amoxicillin Itching  and Swelling    Throat closes up  Has patient had a PCN reaction causing immediate rash, facial/tongue/throat swelling, SOB or lightheadedness with hypotension: Yes Has patient had a PCN reaction causing severe rash involving mucus membranes or skin necrosis: No Has patient had a PCN reaction that required hospitalization: No Has patient had a PCN reaction occurring within the last 10 years: No  If all of the above answers are "NO", then may proceed with Cephalosporin use.  . Codeine Diarrhea and Nausea Only  . Vicodin [Hydrocodone-Acetaminophen] Itching    ROS: Pertinent items noted in HPI and remainder of comprehensive ROS otherwise negative.  HOME MEDS: Current Outpatient Medications  Medication Sig Dispense Refill  . acetaminophen (TYLENOL) 500 MG tablet Take 500 mg by mouth as needed.    . celecoxib (CELEBREX) 200 MG capsule Take 200 mg by mouth 2 (two) times daily as needed for mild pain or moderate pain.     Marland Kitchen clopidogrel (PLAVIX) 75 MG tablet Take 75 mg by mouth daily with breakfast.    . glipiZIDE (GLUCOTROL) 5 MG tablet Take 5 mg by mouth daily before breakfast.    . guaiFENesin (MUCINEX PO) Take by mouth 2 (two) times daily as needed.     . metoprolol tartrate (LOPRESSOR) 25 MG tablet Take 25 mg by mouth 2 (two) times daily.    . nitroGLYCERIN (NITROSTAT) 0.4 MG SL tablet Place 1 tablet (0.4 mg total) under the tongue every 5 (five) minutes as needed for chest pain. 25 tablet 3  . rosuvastatin (CRESTOR) 10 MG tablet Take 10 mg by mouth daily.    Marland Kitchen SYNTHROID 150 MCG tablet Take 150 mcg by mouth daily before breakfast.      No current facility-administered medications for this visit.    LABS/IMAGING: No results found for this or any previous visit (from the past 48 hour(s)). No results found.  VITALS: BP 130/80   Pulse 100   Ht 5' (1.524 m)   Wt 156 lb (70.8 kg)   SpO2 97%   BMI 30.47 kg/m   EXAM: General appearance: alert and no distress Neck: no carotid bruit and no JVD Lungs: clear to auscultation bilaterally Heart: regular rate and rhythm, S1, S2 normal, no murmur, click, rub or gallop Abdomen: soft, non-tender; bowel sounds normal; no masses,  no organomegaly Extremities: extremities normal, atraumatic, no cyanosis or edema Pulses: 2+ and symmetric Skin: Skin color, texture, turgor normal. No rashes or lesions Neurologic: Grossly normal Psych: Pleasant, normal affect  EKG: Normal sinus rhythm at 100, nonspecific ST-T wave changes-personally reviewed  ASSESSMENT: 1. Coronary artery disease status post PCI to the LAD in 2001 2. Two-vessel CABG for bifurcation ostial LAD and circumflex disease in 2002 (LIMA to LAD, free radial to circumflex) 3. Patch angioplasty to the right common femoral artery for iatrogenic injury 4. Dyslipidemia 5. Hypertension 6. Diabetes type 2 7. Hypothyroidism 8. Quit smoking fall 2016  PLAN: 1.   Cheryl Dunn seems to be doing well without chest pain or worsening shortness of breath.  As noted before, she has some degree of fatigue, increased daytime sleepiness, nonrestorative sleep, loud snoring, obesity and other risk factors for sleep apnea.  I recommended a sleep study but  she never had it performed.  Her EPWSS is 17.  She denies any new coronary symptoms.  Her blood pressure is at goal.  Her cholesterol may be still above target.  We will repeat a lipid profile with goal LDL at less than 70. Arrange  for a home sleep study. Plan follow-up with me afterwards.  Chrystie NoseKenneth C. Hilty, MD, [facility]CFACC, FACP  [place]  [facility]  Medical Director of the Advanced Lipid Disorders &  Cardiovascular Risk Reduction Clinic Diplomate of the American Board of Clinical Lipidology Attending Cardiologist  Direct Dial: [phone]  Fax: [phone]  Website:  [email or web address]  Kenneth C Hilty 01/09/2021, 4:02 PM

## 2021-01-09 NOTE — Patient Instructions (Signed)
Medication Instructions:  Your physician recommends that you continue on your current medications as directed. Please refer to the Current Medication list given to you today.  *If you need a refill on your cardiac medications before your next appointment, please call your pharmacy*   Lab Work: FASTING lipid panel to check cholesterol   If you have labs (blood work) drawn today and your tests are completely normal, you will receive your results only by: Marland Kitchen MyChart Message (if you have MyChart) OR . A paper copy in the mail If you have any lab test that is abnormal or we need to change your treatment, we will call you to review the results.   Testing/Procedures: Home Sleep Test - once approved with insurance, you will get a call to arrange your test   Follow-Up: At [facility], you and your health needs are our priority.  As part of our continuing mission to provide you with exceptional heart care, we have created designated Provider Care Teams.  These Care Teams include your primary Cardiologist (physician) and Advanced Practice Providers (APPs -  Physician Assistants and Nurse Practitioners) who all work together to provide you with the care you need, when you need it.  We recommend signing up for the patient portal called "MyChart".  Sign up information is provided on this After Visit Summary.  MyChart is used to connect with patients for Virtual Visits (Telemedicine).  Patients are able to view lab/test results, encounter notes, upcoming appointments, etc.  Non-urgent messages can be sent to your provider as well.   To learn more about what you can do with MyChart, go to [email or web address].    Your next appointment:   6 month(s)  The format for your next appointment:   In Person  Provider:   You may see Dr. Rennis Golden or one of the following Advanced Practice Providers on your designated Care Team:    Azalee Course, PA-C  Micah Flesher, [place] or   Judy Pimple, [place]    Other  Instructions

## 2021-01-10 ENCOUNTER — Telehealth: Payer: Self-pay | Admitting: *Deleted

## 2021-01-10 NOTE — Telephone Encounter (Signed)
HST appointment details left on patient's cell VM.

## 2021-01-14 ENCOUNTER — Other Ambulatory Visit: Payer: Self-pay

## 2021-01-14 ENCOUNTER — Ambulatory Visit: Payer: BC Managed Care – PPO | Attending: Internal Medicine | Admitting: Cardiovascular Disease

## 2021-01-14 DIAGNOSIS — Z7984 Long term (current) use of oral hypoglycemic drugs: Secondary | ICD-10-CM | POA: Diagnosis not present

## 2021-01-14 DIAGNOSIS — G4736 Sleep related hypoventilation in conditions classified elsewhere: Secondary | ICD-10-CM | POA: Diagnosis not present

## 2021-01-14 DIAGNOSIS — R0683 Snoring: Secondary | ICD-10-CM

## 2021-01-14 DIAGNOSIS — Z79890 Hormone replacement therapy (postmenopausal): Secondary | ICD-10-CM | POA: Insufficient documentation

## 2021-01-14 DIAGNOSIS — G4733 Obstructive sleep apnea (adult) (pediatric): Secondary | ICD-10-CM | POA: Insufficient documentation

## 2021-01-14 DIAGNOSIS — Z79899 Other long term (current) drug therapy: Secondary | ICD-10-CM | POA: Insufficient documentation

## 2021-01-14 DIAGNOSIS — Z791 Long term (current) use of non-steroidal anti-inflammatories (NSAID): Secondary | ICD-10-CM | POA: Diagnosis not present

## 2021-01-16 ENCOUNTER — Ambulatory Visit: Payer: BC Managed Care – PPO | Admitting: Internal Medicine

## 2021-01-30 ENCOUNTER — Encounter: Payer: Self-pay | Admitting: Cardiovascular Disease

## 2021-01-30 ENCOUNTER — Ambulatory Visit: Payer: BC Managed Care – PPO | Admitting: Obstetrics & Gynecology

## 2021-01-30 NOTE — Procedures (Signed)
                [facility]         Patient Name: Cheryl Dunn, Cheryl Dunn Date: 01/14/2021 Gender: Female D.O.B: [birth year] Age (years): 64 Referring Provider: Lisette Abu Hilty Height (inches): 60 Interpreting Physician: Nicki Guadalajara MD, ABSM Weight (lbs): 156 RPSGT: Peak, Robert BMI: 30 MRN: [id number] Neck Size: <br>  CLINICAL INFORMATION Sleep Study Type: HST  Indication for sleep study: daytime sleepiness, snoring,   Epworth Sleepiness Score: 14, per Dr. Blanchie Dessert note  SLEEP STUDY TECHNIQUE A multi-channel overnight portable sleep study was performed. The channels recorded were: nasal airflow, thoracic respiratory movement, and oxygen saturation with a pulse oximetry. Snoring was also monitored.  MEDICATIONS acetaminophen (TYLENOL) 500 MG tablet celecoxib (CELEBREX) 200 MG capsule clopidogrel (PLAVIX) 75 MG tablet glipiZIDE (GLUCOTROL) 5 MG tablet guaiFENesin (MUCINEX PO) metoprolol tartrate (LOPRESSOR) 25 MG tablet nitroGLYCERIN (NITROSTAT) 0.4 MG SL tablet rosuvastatin (CRESTOR) 10 MG tablet SYNTHROID 150 MCG tablet Patient self administered medications include: N/A.  SLEEP ARCHITECTURE Patient was studied for 452.5 minutes. The sleep efficiency was 90.7 % and the patient was supine for 33.8%. The arousal index was 0.0 per hour.  RESPIRATORY PARAMETERS The overall AHI was 15.4 per hour, with a central apnea index of 1.9 per hour.  The oxygen nadir was 75% during sleep.  CARDIAC DATA Mean heart rate during sleep was 70.1 bpm.  IMPRESSIONS - Moderate obstructive sleep apnea occurred during this study (AHI 15.4/h). The severity during REM sleep cannot be assessed on this home study. - No significant central sleep apnea occurred during this study (CAI = 1.9/h). - Severe oxygen desaturation to a nadir of 75%. Time spent < 89% was 9.3 minutes. - Patient snored for 154.3 minutes representing 34.1% of sleep.  DIAGNOSIS - Obstructive Sleep Apnea (G47.33) -  Nocturnal Hypoxemia (G47.36)  RECOMMENDATIONS - In this patient with cardiovascular comorbidities recommend therapeutic CPAP titration. If unable to schedule an in-lab titration study initiate Auto-PAP at 6- 16 cm of water. - Avoid alcohol, sedatives and other CNS depressants that may worsen sleep apnea and disrupt normal sleep architecture. - Sleep hygiene should be reviewed to assess factors that may improve sleep quality. - Weight management and regular exercise should be initiated or continued. - Recommend a download and sleep clinic evaluation after one month of CPAP therapy.   [Electronically signed] 01/30/2021 09:00 AM  Nicki Guadalajara MD, [facility], ABSM Diplomate, American Board of Sleep Medicine   NPI: [id number] [facility] PH: [phone]   FX: [phone] ACCREDITED BY THE AMERICAN ACADEMY OF SLEEP MEDICINE

## 2021-02-04 ENCOUNTER — Other Ambulatory Visit: Payer: Self-pay | Admitting: Cardiovascular Disease

## 2021-02-04 DIAGNOSIS — I2510 Atherosclerotic heart disease of native coronary artery without angina pectoris: Secondary | ICD-10-CM

## 2021-02-04 DIAGNOSIS — G4736 Sleep related hypoventilation in conditions classified elsewhere: Secondary | ICD-10-CM

## 2021-02-04 DIAGNOSIS — G4733 Obstructive sleep apnea (adult) (pediatric): Secondary | ICD-10-CM

## 2021-02-04 DIAGNOSIS — I10 Essential (primary) hypertension: Secondary | ICD-10-CM

## 2021-02-05 ENCOUNTER — Telehealth: Payer: Self-pay | Admitting: *Deleted

## 2021-02-05 NOTE — Telephone Encounter (Signed)
Patient notified of HST results and recommendations. She agrees to proceed with the titration study.

## 2021-02-06 ENCOUNTER — Other Ambulatory Visit: Payer: Self-pay

## 2021-02-06 ENCOUNTER — Encounter: Payer: Self-pay | Admitting: Obstetrics & Gynecology

## 2021-02-06 ENCOUNTER — Ambulatory Visit (INDEPENDENT_AMBULATORY_CARE_PROVIDER_SITE_OTHER): Payer: BC Managed Care – PPO | Admitting: Obstetrics & Gynecology

## 2021-02-06 ENCOUNTER — Telehealth: Payer: Self-pay | Admitting: *Deleted

## 2021-02-06 VITALS — BP 141/95 | HR 100 | Ht 60.0 in | Wt 155.0 lb

## 2021-02-06 DIAGNOSIS — Z4689 Encounter for fitting and adjustment of other specified devices: Secondary | ICD-10-CM | POA: Diagnosis not present

## 2021-02-06 DIAGNOSIS — N814 Uterovaginal prolapse, unspecified: Secondary | ICD-10-CM | POA: Diagnosis not present

## 2021-02-06 NOTE — Progress Notes (Signed)
Chief Complaint  Patient presents with  . Pessary Check    Blood pressure (!) 141/95, pulse 100, height 5' (1.524 m), weight 155 lb (70.3 kg).  Cheryl Dunn presents today for routine follow up related to her pessary.   She uses a Milex ring with support #5 She reports little vaginal discharge and no vaginal bleeding   Likert scale(1 not bothersome -5 very bothersome)  :  2  Exam reveals no undue vaginal mucosal pressure of breakdown, no discharge and no vaginal bleeding.  Vaginal Epithelial Abnormality Classification System:   0 0    No abnormalities 1    Epithelial erythema 2    Granulation tissue 3    Epithelial break or erosion, 1 cm or less 4    Epithelial break or erosion, 1 cm or greater  The pessary is removed, cleaned and replaced without difficulty.    No diagnosis found.   Cheryl Dunn will be sen back in 4 months for continued follow up.  Lazaro Arms, MD  02/06/2021 4:49 PM

## 2021-02-06 NOTE — Telephone Encounter (Signed)
[employer] denied CPAP titration. Approved APAP. Order #[id number]. Valid dates 02/06/21 to 05/06/21. APAP orders faxed to [facility].

## 2021-04-02 DIAGNOSIS — G4730 Sleep apnea, unspecified: Secondary | ICD-10-CM

## 2021-04-02 HISTORY — DX: Sleep apnea, unspecified: G47.30

## 2021-04-15 ENCOUNTER — Telehealth: Payer: Self-pay | Admitting: *Deleted

## 2021-04-15 NOTE — Telephone Encounter (Signed)
Staff message sent to [facility] patient needs 90 sleep compliance visit with Dr Tresa Endo.

## 2021-04-29 LAB — COLOGUARD: COLOGUARD: NEGATIVE

## 2021-05-29 ENCOUNTER — Other Ambulatory Visit (HOSPITAL_COMMUNITY): Payer: Self-pay | Admitting: Adult Health Nurse Practitioner

## 2021-05-29 DIAGNOSIS — Z1231 Encounter for screening mammogram for malignant neoplasm of breast: Secondary | ICD-10-CM

## 2021-06-04 ENCOUNTER — Ambulatory Visit (HOSPITAL_COMMUNITY): Payer: BC Managed Care – PPO

## 2021-06-05 ENCOUNTER — Other Ambulatory Visit: Payer: Self-pay

## 2021-06-05 ENCOUNTER — Ambulatory Visit (INDEPENDENT_AMBULATORY_CARE_PROVIDER_SITE_OTHER): Payer: BC Managed Care – PPO | Admitting: Obstetrics & Gynecology

## 2021-06-05 ENCOUNTER — Encounter: Payer: Self-pay | Admitting: Obstetrics & Gynecology

## 2021-06-05 VITALS — BP 128/86 | HR 87 | Ht 60.0 in | Wt 155.5 lb

## 2021-06-05 DIAGNOSIS — N814 Uterovaginal prolapse, unspecified: Secondary | ICD-10-CM | POA: Diagnosis not present

## 2021-06-05 DIAGNOSIS — Z4689 Encounter for fitting and adjustment of other specified devices: Secondary | ICD-10-CM | POA: Diagnosis not present

## 2021-06-05 NOTE — Progress Notes (Signed)
Chief Complaint  Patient presents with   Pessary Check    Blood pressure 128/86, pulse 87, height 5' (1.524 m), weight 155 lb 8 oz (70.5 kg).  Cheryl Dunn presents today for routine follow up related to her pessary.   She uses a Milex ring with support #5 She reports no vaginal discharge and no vaginal bleeding   Likert scale(1 not bothersome -5 very bothersome)  :  1  Exam reveals no undue vaginal mucosal pressure of breakdown, no discharge and no vaginal bleeding.  Vaginal Epithelial Abnormality Classification System:   0 0    No abnormalities 1    Epithelial erythema 2    Granulation tissue 3    Epithelial break or erosion, 1 cm or less 4    Epithelial break or erosion, 1 cm or greater  The pessary is removed, cleaned and replaced without difficulty.    No diagnosis found.   Cheryl Dunn will be sen back in 4 months for continued follow up.  Lazaro Arms, MD  06/05/2021 3:44 PM

## 2021-06-18 ENCOUNTER — Inpatient Hospital Stay (HOSPITAL_COMMUNITY): Admission: RE | Admit: 2021-06-18 | Payer: BC Managed Care – PPO | Source: Ambulatory Visit

## 2021-07-02 ENCOUNTER — Encounter: Payer: Self-pay | Admitting: Cardiovascular Disease

## 2021-07-02 ENCOUNTER — Other Ambulatory Visit: Payer: Self-pay

## 2021-07-02 ENCOUNTER — Ambulatory Visit (INDEPENDENT_AMBULATORY_CARE_PROVIDER_SITE_OTHER): Payer: BC Managed Care – PPO | Admitting: Cardiovascular Disease

## 2021-07-02 DIAGNOSIS — G4733 Obstructive sleep apnea (adult) (pediatric): Secondary | ICD-10-CM | POA: Diagnosis not present

## 2021-07-02 DIAGNOSIS — I2510 Atherosclerotic heart disease of native coronary artery without angina pectoris: Secondary | ICD-10-CM | POA: Diagnosis not present

## 2021-07-02 DIAGNOSIS — R0683 Snoring: Secondary | ICD-10-CM | POA: Diagnosis not present

## 2021-07-02 DIAGNOSIS — I10 Essential (primary) hypertension: Secondary | ICD-10-CM

## 2021-07-02 DIAGNOSIS — Z951 Presence of aortocoronary bypass graft: Secondary | ICD-10-CM

## 2021-07-02 DIAGNOSIS — G4736 Sleep related hypoventilation in conditions classified elsewhere: Secondary | ICD-10-CM

## 2021-07-02 DIAGNOSIS — E118 Type 2 diabetes mellitus with unspecified complications: Secondary | ICD-10-CM

## 2021-07-02 DIAGNOSIS — R002 Palpitations: Secondary | ICD-10-CM

## 2021-07-02 DIAGNOSIS — E785 Hyperlipidemia, unspecified: Secondary | ICD-10-CM

## 2021-07-02 DIAGNOSIS — E039 Hypothyroidism, unspecified: Secondary | ICD-10-CM

## 2021-07-02 NOTE — Progress Notes (Deleted)
Cardiology Office Note    Date:  07/02/2021   ID:  Odester, Nilson 15-Feb-1956, MRN [id number]  PCP:  Roe Rutherford, NP  Cardiologist:  Nicki Guadalajara, MD   No chief complaint on file.   History of Present Illness:  Cheryl Dunn is a 65 y.o. female ***    Past Medical History:  Diagnosis Date   Arthritis    CAD (coronary artery disease)    s/p CABG & caths with stenting    Diabetes (HCC) 08/2013   GERD (gastroesophageal reflux disease)    Hyperlipidemia    Hypertension    Hypothyroidism     Past Surgical History:  Procedure Laterality Date   APPENDECTOMY     CHOLECYSTECTOMY  1982   CORONARY ANGIOPLASTY WITH STENT PLACEMENT  08/2000   stent   CORONARY ANGIOPLASTY WITH STENT PLACEMENT  06/2009   stent   CORONARY ARTERY BYPASS GRAFT  12/14/2000   FEMORAL ARTERY REPAIR  2010   post-cath   ORIF ANKLE FRACTURE Right    TUBAL LIGATION  1984   UMBILICAL HERNIA REPAIR N/A 10/22/2017   Procedure: OPEN EPIGASTRIC HERNIA REPAIR WITH MESH;  Surgeon: Lucretia Roers, MD;  Location: [facility];  Service: General;  Laterality: N/A;    Current Medications: Outpatient Medications Prior to Visit  Medication Sig Dispense Refill   celecoxib (CELEBREX) 200 MG capsule Take 200 mg by mouth 2 (two) times daily as needed for mild pain or moderate pain.      clopidogrel (PLAVIX) 75 MG tablet Take 75 mg by mouth daily with breakfast.     fluticasone (FLONASE) 50 MCG/ACT nasal spray Place into both nostrils.     guaiFENesin (MUCINEX PO) Take by mouth 2 (two) times daily as needed.     levothyroxine (SYNTHROID) 125 MCG tablet Take 125 mcg by mouth daily before breakfast.     metoprolol tartrate (LOPRESSOR) 25 MG tablet Take 25 mg by mouth 2 (two) times daily.     nitroGLYCERIN (NITROSTAT) 0.4 MG SL tablet Place 1 tablet (0.4 mg total) under the tongue every 5 (five) minutes as needed for chest pain. 25 tablet 3   ONETOUCH ULTRA test strip 1 each daily.     OZEMPIC, 0.25 OR 0.5 MG/DOSE, 2  MG/1.5ML SOPN Inject into the skin.     rosuvastatin (CRESTOR) 10 MG tablet Take 10 mg by mouth daily.     metFORMIN (GLUCOPHAGE-XR) 500 MG 24 hr tablet Take 500 mg by mouth 2 (two) times daily. (Patient not taking: Reported on 07/02/2021)     SYNTHROID 150 MCG tablet Take 150 mcg by mouth daily before breakfast.  (Patient not taking: Reported on 07/02/2021)     No facility-administered medications prior to visit.     Allergies:   Amoxicillin, Codeine, and Vicodin [hydrocodone-acetaminophen]   Social History   Socioeconomic History   Marital status: Married    Spouse name: Not on file   Number of children: 1   Years of education: Not on file   Highest education level: Not on file  Occupational History   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[employer], DDS  Tobacco Use   Smoking status: Former    Packs/day: 0.25    Years: 20.00    Pack years: 5.00    Types: Cigarettes    Quit date: 10/18/2004    Years since quitting: 16.7   Smokeless tobacco: Never  Vaping Use   Vaping Use: Never used  Substance and Sexual Activity  Alcohol use: No    Alcohol/week: 0.0 standard drinks   Drug use: No   Sexual activity: Yes    Birth control/protection: Post-menopausal  Other Topics Concern   Not on file  Social History Narrative   Not on file   Social Determinants of Health   Financial Resource Strain: Not on file  Food Insecurity: Not on file  Transportation Needs: Not on file  Physical Activity: Not on file  Stress: Not on file  Social Connections: Not on file     Family History:  The patient's ***family history includes Diabetes in her father and mother; Heart attack in her maternal grandfather and sister; Hyperlipidemia in her sister; Hypertension in her father, mother, and paternal grandmother; Liver disease in her paternal grandfather; Other in her daughter.   ROS General: Negative; No fevers, chills, or night sweats;  HEENT: Negative; No changes in vision or  hearing, sinus congestion, difficulty swallowing Pulmonary: Negative; No cough, wheezing, shortness of breath, hemoptysis Cardiovascular: Negative; No chest pain, presyncope, syncope, palpitations GI: Negative; No nausea, vomiting, diarrhea, or abdominal pain GU: Negative; No dysuria, hematuria, or difficulty voiding Musculoskeletal: Negative; no myalgias, joint pain, or weakness Hematologic/Oncology: Negative; no easy bruising, bleeding Endocrine: Negative; no heat/cold intolerance; no diabetes Neuro: Negative; no changes in balance, headaches Skin: Negative; No rashes or skin lesions Psychiatric: Negative; No behavioral problems, depression Sleep: Negative; No snoring, daytime sleepiness, hypersomnolence, bruxism, restless legs, hypnogognic hallucinations, no cataplexy Other comprehensive 14 point system review is negative.   PHYSICAL EXAM:   VS:  BP 128/82   Pulse 81   Ht 5' (1.524 m)   Wt 151 lb (68.5 kg)   SpO2 97%   BMI 29.49 kg/m    Wt Readings from Last 3 Encounters:  07/02/21 151 lb (68.5 kg)  06/05/21 155 lb 8 oz (70.5 kg)  02/06/21 155 lb (70.3 kg)    General: Alert, oriented, no distress.  Skin: normal turgor, no rashes, warm and dry HEENT: Normocephalic, atraumatic. Pupils equal round and reactive to light; sclera anicteric; extraocular muscles intact; Fundi ** Nose without nasal septal hypertrophy Mouth/Parynx benign; Mallinpatti scale Neck: No JVD, no carotid bruits; normal carotid upstroke Lungs: clear to ausculatation and percussion; no wheezing or rales Chest wall: without tenderness to palpitation Heart: PMI not displaced, RRR, s1 s2 normal, 1/6 systolic murmur, no diastolic murmur, no rubs, gallops, thrills, or heaves Abdomen: soft, nontender; no hepatosplenomehaly, BS+; abdominal aorta nontender and not dilated by palpation. Back: no CVA tenderness Pulses 2+ Musculoskeletal: full range of motion, normal strength, no joint deformities Extremities: no  clubbing cyanosis or edema, Homan's sign negative  Neurologic: grossly nonfocal; Cranial nerves grossly wnl Psychologic: Normal mood and affect   Studies/Labs Reviewed:   EKG:  EKG is ordered today.  ECG (independently read by me):  NSR at 81; no ectopy, normal intervals  Recent Labs: BMP Latest Ref Rng & Units 10/18/2017  Glucose 65 - 99 mg/dL 563(O)  BUN 6 - 20 mg/dL 10  Creatinine 7.56 - 4.33 mg/dL 2.95  Sodium 188 - 416 mmol/L 138  Potassium 3.5 - 5.1 mmol/L 4.0  Chloride 101 - 111 mmol/L 106  CO2 22 - 32 mmol/L 26  Calcium 8.9 - 10.3 mg/dL 9.4     No flowsheet data found.  CBC Latest Ref Rng & Units 10/18/2017  WBC 4.0 - 10.5 K/uL 5.8  Hemoglobin 12.0 - 15.0 g/dL 60.6  Hematocrit 30.1 - 46.0 % 41.9  Platelets 150 - 400 K/uL 238  Lab Results  Component Value Date   MCV 90.1 10/18/2017   No results found for: TSH Lab Results  Component Value Date   HGBA1C 7.0 (H) 10/18/2017     BNP No results found for: BNP  ProBNP No results found for: PROBNP   Lipid Panel     Component Value Date/Time   CHOL 169 10/20/2013 0710   TRIG 75 10/20/2013 0710   HDL 62 10/20/2013 0710   LDLCALC 92 10/20/2013 0710     RADIOLOGY: No results found.   Additional studies/ records that were reviewed today include:  ***    ASSESSMENT:    No diagnosis found.   PLAN:  ***   Medication Adjustments/Labs and Tests Ordered: Current medicines are reviewed at length with the patient today.  Concerns regarding medicines are outlined above.  Medication changes, Labs and Tests ordered today are listed in the Patient Instructions below. There are no Patient Instructions on file for this visit.   Signed, Nicki Guadalajara, MD  07/02/2021 9:20 AM    [facility] [address], [address], [place], [place]  [address] Phone: [phone]

## 2021-07-02 NOTE — Patient Instructions (Signed)
Medication Instructions:  Continue current medications  *If you need a refill on your cardiac medications before your next appointment, please call your pharmacy*   Lab Work: None Ordered   Testing/Procedures: None Ordered   Follow-Up: At [facility], you and your health needs are our priority.  As part of our continuing mission to provide you with exceptional heart care, we have created designated Provider Care Teams.  These Care Teams include your primary Cardiologist (physician) and Advanced Practice Providers (APPs -  Physician Assistants and Nurse Practitioners) who all work together to provide you with the care you need, when you need it.  We recommend signing up for the patient portal called "MyChart".  Sign up information is provided on this After Visit Summary.  MyChart is used to connect with patients for Virtual Visits (Telemedicine).  Patients are able to view lab/test results, encounter notes, upcoming appointments, etc.  Non-urgent messages can be sent to your provider as well.   To learn more about what you can do with MyChart, go to [email or web address].    Your next appointment:   1 year(s)  The format for your next appointment:   In Person  Provider:   You may see Kayly Kriegel Kelly, MD or one of the following Advanced Practice Providers on your designated Care Team:   Hao Meng, PA-C Angela Duke, PA-C or  Krista Kroeger, PA-C     

## 2021-07-09 ENCOUNTER — Other Ambulatory Visit: Payer: Self-pay

## 2021-07-09 ENCOUNTER — Encounter: Payer: Self-pay | Admitting: Cardiovascular Disease

## 2021-07-09 ENCOUNTER — Ambulatory Visit (HOSPITAL_COMMUNITY)
Admission: RE | Admit: 2021-07-09 | Discharge: 2021-07-09 | Disposition: A | Discharge status: Home / Self Care | Payer: BC Managed Care – PPO | Source: Ambulatory Visit | Attending: Adult Health Nurse Practitioner | Admitting: Adult Health Nurse Practitioner

## 2021-07-09 DIAGNOSIS — Z1231 Encounter for screening mammogram for malignant neoplasm of breast: Secondary | ICD-10-CM | POA: Diagnosis not present

## 2021-07-09 NOTE — Progress Notes (Signed)
 Cardiology Office Note    Date:  07/09/2021   ID:  Cheryl Dunn, DOB 09/13/1956, MRN [id number]  PCP:  Keatts, Courtney, NP  Cardiologist:  Thomas Kelly, MD (sleep); Dr. Hilty  New sleep evaluation   History of Present Illness:  Cheryl Dunn is a 65 y.o. female who is followed by Dr. Hilty for cardiology care.  Patient has a history of CAD and underwent stenting to the proximal 2001.  Due to progressive disease he subsequently underwent CABG revascularization surgery in 2002 with a LIMA to LAD and radial artery to the circumflex.  She has a history of hyperlipidemia, mild hypothyroidism and hypertension.  Due to concerns for obstructive sleep apnea with complaints of snoring and daytime sleepiness she was referred for home sleep test.  This was done on January 14, 2021.  She was found to have moderate overall sleep apnea with an AHI of 15.4/h but had significant oxygen desaturation to nadir of 75%.  Since this was a home study in severity during REM sleep was unable to be assessed.  She snored for 154 minutes or 34% of her sleep.  At the time of her sleep study, her Epworth Sleepiness Scale score endorsed at 14.  She was denied an in lab titration and ultimately commenced therapy with AutoPap with [facility] as her DME company.  On April 09, 2021 she received a new ResMed air sense 11 CPAP AutoSet unit.  A download was obtained from August 1 through July 01, 2021 which demonstrates she is meeting compliance with usage days but is not meeting compliance with usage greater than 4 hours and only 30%.  Her average usage on days used was only 3 hours and 33 minutes.  However, at her auto pressure range of 6 to 16 cm of water, AHI is excellent at 1.0.  Her 95th percentile pressure is 10.1 with a maximum average pressure of 11.0.  Since initiating CPAP therapy, she believes her previous nocturnal palpitations are significantly improved.  Typically she goes to bed at 9 PM and wakes up for good  approximately 5:45 AM.  She typically urinates 2-3 times per night.  She has been using a full facemask.  A new Epworth Sleepiness Scale score was calculated in the office today and this endorsed at 8 suggestive of improvement from her previous CPAP therapy Epworth scale.  She presents for her initial evaluation.  Past Medical History:  Diagnosis Date   Arthritis    CAD (coronary artery disease)    s/p CABG & caths with stenting    Diabetes (HCC) 08/2013   GERD (gastroesophageal reflux disease)    Hyperlipidemia    Hypertension    Hypothyroidism     Past Surgical History:  Procedure Laterality Date   APPENDECTOMY     CHOLECYSTECTOMY  1982   CORONARY ANGIOPLASTY WITH STENT PLACEMENT  08/2000   stent   CORONARY ANGIOPLASTY WITH STENT PLACEMENT  06/2009   stent   CORONARY ARTERY BYPASS GRAFT  12/14/2000   FEMORAL ARTERY REPAIR  2010   post-cath   ORIF ANKLE FRACTURE Right    TUBAL LIGATION  1984   UMBILICAL HERNIA REPAIR N/A 10/22/2017   Procedure: OPEN EPIGASTRIC HERNIA REPAIR WITH MESH;  Surgeon: Bridges, Lindsay C, MD;  Location: [facility];  Service: General;  Laterality: N/A;    Current Medications: Outpatient Medications Prior to Visit  Medication Sig Dispense Refill   celecoxib (CELEBREX) 200 MG capsule Take 200 mg by mouth 2 (two) times   daily as needed for mild pain or moderate pain.      clopidogrel (PLAVIX) 75 MG tablet Take 75 mg by mouth daily with breakfast.     fluticasone (FLONASE) 50 MCG/ACT nasal spray Place into both nostrils.     guaiFENesin (MUCINEX PO) Take by mouth 2 (two) times daily as needed.     levothyroxine (SYNTHROID) 125 MCG tablet Take 125 mcg by mouth daily before breakfast.     metoprolol tartrate (LOPRESSOR) 25 MG tablet Take 25 mg by mouth 2 (two) times daily.     nitroGLYCERIN (NITROSTAT) 0.4 MG SL tablet Place 1 tablet (0.4 mg total) under the tongue every 5 (five) minutes as needed for chest pain. 25 tablet 3   ONETOUCH ULTRA test strip 1 each  daily.     OZEMPIC, 0.25 OR 0.5 MG/DOSE, 2 MG/1.5ML SOPN Inject into the skin.     rosuvastatin (CRESTOR) 10 MG tablet Take 10 mg by mouth daily.     metFORMIN (GLUCOPHAGE-XR) 500 MG 24 hr tablet Take 500 mg by mouth 2 (two) times daily. (Patient not taking: Reported on 07/02/2021)     SYNTHROID 150 MCG tablet Take 150 mcg by mouth daily before breakfast.  (Patient not taking: Reported on 07/02/2021)     No facility-administered medications prior to visit.     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[employer], DDS  Tobacco Use   Smoking status: Former    Packs/day: 0.25    Years: 20.00    Pack years: 5.00    Types: Cigarettes    Quit date: 10/18/2004    Years since quitting: 16.7   Smokeless tobacco: Never  Vaping Use   Vaping Use: Never used  Substance and Sexual Activity   Alcohol use: No    Alcohol/week: 0.0 standard drinks   Drug use: No   Sexual activity: Yes    Birth control/protection: Post-menopausal  Other Topics Concern   Not on file  Social History Narrative   Not on file   Social Determinants of Health   Financial Resource Strain: Not on file  Food Insecurity: Not on file  Transportation Needs: Not on file  Physical Activity: Not on file  Stress: Not on file  Social Connections: Not on file    Social history is notable that she was born in [place] and moved to [place] in 2014.  She is married for 45 years.  He works as a teller at [employer].  There is remote tobacco history but she quit over 15 years ago.  She does not drink alcohol.  She exercises approximately 4 times per week  Family History:  The patient's family history includes Diabetes in her father  and mother; Heart attack in her maternal grandfather and sister; Hyperlipidemia in her sister; Hypertension in her father, mother, and paternal grandmother; Liver disease in her paternal grandfather; Other in her daughter.  Her mother is living at 85 years old and has hypertension diabetes mellitus and hyperlipidemia.  Her father died at age 82 but had suffered a heart attack at 70 and underwent quadruple bypass surgery.  She has 1 brother age 58 who has hypertension.  Her   sister age 64 had a heart attack and CABG surgery at age 59.  She has 1 child age 38.  ROS General: Negative; No fevers, chills, or night sweats;  HEENT: Negative; No changes in vision or hearing, sinus congestion, difficulty swallowing Pulmonary: Negative; No cough, wheezing, shortness of breath, hemoptysis Cardiovascular: CAD, status post CABG revascularization surgery GI: Negative; No nausea, vomiting, diarrhea, or abdominal pain GU: Negative; No dysuria, hematuria, or difficulty voiding Musculoskeletal: Negative; no myalgias, joint pain, or weakness Hematologic/Oncology: Negative; no easy bruising, bleeding Endocrine: Negative; no heat/cold intolerance; no diabetes Neuro: Negative; no changes in balance, headaches Skin: Negative; No rashes or skin lesions Psychiatric: Negative; No behavioral problems, depression Sleep: OSA, initiated CPAP therapy in June May 2022.  Snoring and daytime sleepiness have improved.  No bruxism, restless legs, hypnogognic hallucinations, no cataplexy Other comprehensive 14 point system review is negative.   PHYSICAL EXAM:   VS:  BP 128/82   Pulse 81   Ht 5' (1.524 m)   Wt 151 lb (68.5 kg)   SpO2 97%   BMI 29.49 kg/m     Repeat blood pressure was 126/82  Wt Readings from Last 3 Encounters:  07/02/21 151 lb (68.5 kg)  06/05/21 155 lb 8 oz (70.5 kg)  02/06/21 155 lb (70.3 kg)    General: Alert, oriented, no distress.  Skin: normal turgor, no rashes, warm and dry HEENT:  Normocephalic, atraumatic. Pupils equal round and reactive to light; sclera anicteric; extraocular muscles intact;  Nose without nasal septal hypertrophy Mouth/Parynx benign; Mallinpatti scale 3/4 Neck: No JVD, no carotid bruits; normal carotid upstroke Lungs: clear to ausculatation and percussion; no wheezing or rales Chest wall: without tenderness to palpitation Heart: PMI not displaced, RRR, s1 s2 normal, 1/6 systolic murmur, no diastolic murmur, no rubs, gallops, thrills, or heaves Abdomen: soft, nontender; no hepatosplenomehaly, BS+; abdominal aorta nontender and not dilated by palpation. Back: no CVA tenderness Pulses 2+ Musculoskeletal: full range of motion, normal strength, no joint deformities Extremities: no clubbing cyanosis or edema, Homan's sign negative  Neurologic: grossly nonfocal; Cranial nerves grossly wnl Psychologic: Normal mood and affect   Studies/Labs Reviewed:   EKG:  EKG is ordered today.  ECG (independently read by me):  NSR at 81; no ectopy, normal intervals  Recent Labs: BMP Latest Ref Rng & Units 10/18/2017  Glucose 65 - 99 mg/dL 104(H)  BUN 6 - 20 mg/dL 10  Creatinine 0.44 - 1.00 mg/dL 0.71  Sodium 135 - 145 mmol/L 138  Potassium 3.5 - 5.1 mmol/L 4.0  Chloride 101 - 111 mmol/L 106  CO2 22 - 32 mmol/L 26  Calcium 8.9 - 10.3 mg/dL 9.4     No flowsheet data found.  CBC Latest Ref Rng & Units 10/18/2017  WBC 4.0 - 10.5 K/uL 5.8  Hemoglobin 12.0 - 15.0 g/dL 13.5  Hematocrit 36.0 - 46.0 % 41.9  Platelets 150 - 400 K/uL 238   Lab Results  Component Value Date   MCV 90.1 10/18/2017   No results found for: TSH Lab Results  Component Value Date   HGBA1C 7.0 (H) 10/18/2017     BNP No results found for: BNP  ProBNP No results found for: PROBNP   Lipid Panel     Component Value Date/Time   CHOL 169 10/20/2013 0710   TRIG 75 10/20/2013 0710   HDL 62 10/20/2013 0710   LDLCALC 92 10/20/2013 0710     RADIOLOGY: No results  found.   Additional studies/ records that were reviewed   today include:   January 14, 2021 CLINICAL INFORMATION Sleep Study Type: HST   Indication for sleep study: daytime sleepiness, snoring,    Epworth Sleepiness Score: 14, per Dr. Hilty's note   SLEEP STUDY TECHNIQUE A multi-channel overnight portable sleep study was performed. The channels recorded were: nasal airflow, thoracic respiratory movement, and oxygen saturation with a pulse oximetry. Snoring was also monitored.   MEDICATIONS acetaminophen (TYLENOL) 500 MG tablet celecoxib (CELEBREX) 200 MG capsule clopidogrel (PLAVIX) 75 MG tablet glipiZIDE (GLUCOTROL) 5 MG tablet guaiFENesin (MUCINEX PO) metoprolol tartrate (LOPRESSOR) 25 MG tablet nitroGLYCERIN (NITROSTAT) 0.4 MG SL tablet rosuvastatin (CRESTOR) 10 MG tablet SYNTHROID 150 MCG tablet Patient self administered medications include: N/A.   SLEEP ARCHITECTURE Patient was studied for 452.5 minutes. The sleep efficiency was 90.7 % and the patient was supine for 33.8%. The arousal index was 0.0 per hour.   RESPIRATORY PARAMETERS The overall AHI was 15.4 per hour, with a central apnea index of 1.9 per hour.   The oxygen nadir was 75% during sleep.   CARDIAC DATA Mean heart rate during sleep was 70.1 bpm.   IMPRESSIONS - Moderate obstructive sleep apnea occurred during this study (AHI 15.4/h). The severity during REM sleep cannot be assessed on this home study. - No significant central sleep apnea occurred during this study (CAI = 1.9/h). - Severe oxygen desaturation to a nadir of 75%. Time spent < 89% was 9.3 minutes. - Patient snored for 154.3 minutes representing 34.1% of sleep.   DIAGNOSIS - Obstructive Sleep Apnea (G47.33) - Nocturnal Hypoxemia (G47.36)   RECOMMENDATIONS - In this patient with cardiovascular comorbidities recommend therapeutic CPAP titration. If unable to schedule an in-lab titration study initiate Auto-PAP at 6- 16 cm of water. - Avoid  alcohol, sedatives and other CNS depressants that may worsen sleep apnea and disrupt normal sleep architecture. - Sleep hygiene should be reviewed to assess factors that may improve sleep quality. - Weight management and regular exercise should be initiated or continued. - Recommend a download and sleep clinic evaluation after one month of CPAP therapy.   ASSESSMENT:    1. OSA (obstructive sleep apnea)   2. Hypoxemia associated with sleep   3. Snoring   4. Coronary artery disease involving native coronary artery of native heart without angina pectoris   5. S/P CABG x 2   6. Essential hypertension   7. Nocturnal palpitations   8. Dyslipidemia   9. Type 2 diabetes mellitus with complication, without long-term current use of insulin (HCC)   10. Hypothyroidism, unspecified type     PLAN:  Ms. Cheryl Dunn is a 65-year-old female who has tablet CAD and underwent initial LAD intervention in 2001 and in 2002 required CABG revascularization surgery of 2 arterial conduits including a LIMA to the LAD and radial artery to the circumflex.  She has a history of hyperlipidemia and previously had used tobacco.  Due to concerns for obstructive sleep apnea she underwent a home sleep study which confirmed at least moderate overall sleep apnea with an AHI 15.4/h.  She had severe oxygen desaturation to nadir of 75%.  The severity during REM sleep could not be assessed on this home study but I suspect with her significant oxygen desaturation in REM sleep AHI was significant.  She snored for 34% of the evening.  I had a lengthy discussion with her today and reviewed with her her sleep study.  In addition I reviewed normal sleep architecture and discussed health untreated sleep apnea can create disruptive   sleep architecture.  We discussed reduction of parasympathetic tone and increase sympathetic stimulation as result of frequent arousals.  We discussed that this could play a role in point difficulty in controlling her  blood pressure as well as potential nocturnal arrhythmias.  We discussed increased risk for potential atrial fibrillation.  In addition her significant oxygen desaturation places him at risk for potential nocturnal ischemia both cardiac as well as cerebrovascular.  In addition I discussed its effects on inflammation, GERD, as well as blood sugar.  Recently she had noticed that she had been having nocturia at least 2 times per night.  I extensively reviewed the pathophysiology associated with increased nocturia.  Of note, last evening she did not use her CPAP device and noted that she was up several more times per night for urination.  I had a lengthy discussion with her regarding importance of using CPAP for the nights duration and emphasized that most likely sleep apnea is more significant during REM sleep and that intolerance of REM sleep occurs in the second half of the night.  We discussed the requirements that are necessary to meet compliance.  Presently she is compliant with usage days but not compliant with usage greater than 4 hours.  I discussed optimal sleep duration of 79 hours and that she should use CPAP therapy for the entire nights duration and not taken off when she goes to the bathroom.  She should not get back into bed unless mask is placed on which I am sure most likely she will have improved sleep for the remainder of the.  She is aware that she has a 90-day window to meet compliance.  I stressed with her the importance of doing so otherwise she is at risk that her machine may be taken away if compliance is not met.  Presently, her blood pressure is stable on her current regimen of metoprolol tartrate 25 mg twice a day.  She is on rosuvastatin 10 mg.  Hyperlipidemia with target LDL less than 70.  Lipid panel in March 2022 was 78.  She is also on levothyroxine 125 mcg for hypothyroidism.  She continues to be on clopidogrel for antiplatelet therapy and metformin for her diabetes mellitus. A new  download will be obtained in 3 weeks to document improved compliance.  She also has had issues using a full facemask.  Since she is not requiring very high pressures I have recommended a trial of the ResMed AirFit N 30i mask with the tubing will arise from the crown of her head and the nasal portion is under her nose.  I will see her in 1 year or as needed for follow-up sleep evaluation.   Medication Adjustments/Labs and Tests Ordered: Current medicines are reviewed at length with the patient today.  Concerns regarding medicines are outlined above.  Medication changes, Labs and Tests ordered today are listed in the Patient Instructions below. Patient Instructions  Medication Instructions:  Continue current medications  *If you need a refill on your cardiac medications before your next appointment, please call your pharmacy*   Lab Work: None Ordered   Testing/Procedures: None Ordered   Follow-Up: At [facility], you and your health needs are our priority.  As part of our continuing mission to provide you with exceptional heart care, we have created designated Provider Care Teams.  These Care Teams include your primary Cardiologist (physician) and Advanced Practice Providers (APPs -  Physician Assistants and Nurse Practitioners) who all work together to provide you with the  care you need, when you need it.  We recommend signing up for the patient portal called "MyChart".  Sign up information is provided on this After Visit Summary.  MyChart is used to connect with patients for Virtual Visits (Telemedicine).  Patients are able to view lab/test results, encounter notes, upcoming appointments, etc.  Non-urgent messages can be sent to your provider as well.   To learn more about what you can do with MyChart, go to [email or web address].    Your next appointment:   1 year(s)  The format for your next appointment:   In Person  Provider:   You may see Shelva Majestic, MD or one of the  following Advanced Practice Providers on your designated Care Team:   Almyra Deforest, PA-C Fabian Sharp, PA-C or  Roby Lofts, [place]      Signed, Shelva Majestic, MD  07/09/2021 4:34 PM    [facility] [address] [place], [place], [place]  [address] Phone: [phone]

## 2021-10-09 ENCOUNTER — Other Ambulatory Visit (HOSPITAL_COMMUNITY)
Admission: RE | Admit: 2021-10-09 | Discharge: 2021-10-09 | Disposition: A | Discharge status: Home / Self Care | Payer: BC Managed Care – PPO | Source: Ambulatory Visit | Attending: Obstetrics & Gynecology | Admitting: Obstetrics & Gynecology

## 2021-10-09 ENCOUNTER — Encounter: Payer: Self-pay | Admitting: Obstetrics & Gynecology

## 2021-10-09 ENCOUNTER — Ambulatory Visit (INDEPENDENT_AMBULATORY_CARE_PROVIDER_SITE_OTHER): Payer: BC Managed Care – PPO | Admitting: Obstetrics & Gynecology

## 2021-10-09 ENCOUNTER — Other Ambulatory Visit: Payer: Self-pay

## 2021-10-09 DIAGNOSIS — N760 Acute vaginitis: Secondary | ICD-10-CM | POA: Diagnosis not present

## 2021-10-09 DIAGNOSIS — Z4689 Encounter for fitting and adjustment of other specified devices: Secondary | ICD-10-CM

## 2021-10-09 DIAGNOSIS — N814 Uterovaginal prolapse, unspecified: Secondary | ICD-10-CM

## 2021-10-09 DIAGNOSIS — Z01419 Encounter for gynecological examination (general) (routine) without abnormal findings: Secondary | ICD-10-CM

## 2021-10-09 DIAGNOSIS — B9689 Other specified bacterial agents as the cause of diseases classified elsewhere: Secondary | ICD-10-CM | POA: Diagnosis not present

## 2021-10-09 MED ORDER — METRONIDAZOLE 0.75 % VA GEL: VAGINAL | 5 refills | Status: AC

## 2021-10-09 NOTE — Addendum Note (Signed)
Addended by: Leilani Able, Fernanda Twaddell A on: 10/09/2021 04:34 PM   Modules accepted: Orders

## 2021-10-09 NOTE — Progress Notes (Signed)
Chief Complaint  Patient presents with   Pessary Check    There were no vitals taken for this visit.  Cheryl Dunn presents today for routine follow up related to her pessary.   She uses a Milex ring with support #5 She reports moderate vaginal discharge and no vaginal bleeding   Likert scale(1 not bothersome -5 very bothersome)  :  2  Exam reveals no undue vaginal mucosal pressure of breakdown, moderate discharge and little vaginal bleeding.  Vaginal Epithelial Abnormality Classification System:   1 0    No abnormalities 1    Epithelial erythema 2    Granulation tissue 3    Epithelial break or erosion, 1 cm or less 4    Epithelial break or erosion, 1 cm or greater  The pessary is removed, cleaned and replaced without difficulty.   Pap performed per request    ICD-10-CM   1. Pessary maintenance, Milex ring with support #5, OF 11/2018  Z46.89     2. Cystocele with uterine prolapse  N81.4     3. BV (bacterial vaginosis)  N76.0    B96.89        [name] will be sen back in 3 months for continued follow up.  Lazaro Arms, MD  10/09/2021 4:29 PM

## 2021-10-17 LAB — CYTOLOGY - PAP
Comment: NEGATIVE
Diagnosis: NEGATIVE
Diagnosis: REACTIVE
High risk HPV: NEGATIVE

## 2021-12-21 ENCOUNTER — Ambulatory Visit
Admission: EM | Admit: 2021-12-21 | Discharge: 2021-12-21 | Disposition: A | Discharge status: Home / Self Care | Payer: BC Managed Care – PPO | Attending: Urgent Care | Admitting: Urgent Care

## 2021-12-21 ENCOUNTER — Other Ambulatory Visit: Payer: Self-pay

## 2021-12-21 ENCOUNTER — Ambulatory Visit (INDEPENDENT_AMBULATORY_CARE_PROVIDER_SITE_OTHER): Payer: BC Managed Care – PPO

## 2021-12-21 DIAGNOSIS — E119 Type 2 diabetes mellitus without complications: Secondary | ICD-10-CM | POA: Diagnosis not present

## 2021-12-21 DIAGNOSIS — S20211A Contusion of right front wall of thorax, initial encounter: Secondary | ICD-10-CM

## 2021-12-21 DIAGNOSIS — Z951 Presence of aortocoronary bypass graft: Secondary | ICD-10-CM

## 2021-12-21 DIAGNOSIS — R0789 Other chest pain: Secondary | ICD-10-CM | POA: Diagnosis not present

## 2021-12-21 DIAGNOSIS — R0782 Intercostal pain: Secondary | ICD-10-CM

## 2021-12-21 DIAGNOSIS — Z8679 Personal history of other diseases of the circulatory system: Secondary | ICD-10-CM | POA: Diagnosis not present

## 2021-12-21 DIAGNOSIS — W19XXXA Unspecified fall, initial encounter: Secondary | ICD-10-CM | POA: Diagnosis not present

## 2021-12-21 MED ORDER — TIZANIDINE HCL 4 MG PO TABS: 4.0000 mg | ORAL_TABLET | Freq: Every day | ORAL | 0 refills | Status: DC

## 2021-12-21 MED ORDER — ACETAMINOPHEN 325 MG PO TABS: 650.0000 mg | ORAL_TABLET | Freq: Four times a day (QID) | ORAL | 0 refills | Status: DC | PRN

## 2021-12-21 NOTE — ED Provider Notes (Signed)
[facility]   MRN: [id number] DOB: 1955/11/29  Subjective:   Cheryl Dunn is a 66 y.o. female presenting for 4-day history of acute onset persistent and worsening right-sided chest wall pain.  Patient states that her dog yanked her and she landed into the bumper of a car making impact against the right side of her ribs.  She has since had persistent pain, worse with taking a deep breath and lifting her arm.  Has previously suffered a rib fracture and reports that it feels the same way.  Has a history of heart disease, status post CABG x2.  She is a type II diabetic treated without insulin.  No current facility-administered medications for this encounter.  Current Outpatient Medications:    celecoxib (CELEBREX) 200 MG capsule, Take 200 mg by mouth 2 (two) times daily as needed for mild pain or moderate pain. , Disp: , Rfl:    clopidogrel (PLAVIX) 75 MG tablet, Take 75 mg by mouth daily with breakfast., Disp: , Rfl:    fluticasone (FLONASE) 50 MCG/ACT nasal spray, Place into both nostrils., Disp: , Rfl:    guaiFENesin (MUCINEX PO), Take by mouth 2 (two) times daily as needed., Disp: , Rfl:    levothyroxine (SYNTHROID) 125 MCG tablet, Take 125 mcg by mouth daily before breakfast., Disp: , Rfl:    metFORMIN (GLUCOPHAGE-XR) 500 MG 24 hr tablet, Take 500 mg by mouth 2 (two) times daily., Disp: , Rfl:    metoprolol tartrate (LOPRESSOR) 25 MG tablet, Take 25 mg by mouth 2 (two) times daily., Disp: , Rfl:    metroNIDAZOLE (METROGEL VAGINAL) 0.75 % vaginal gel, Nightly x 5 nights, Disp: 70 g, Rfl: 5   nitroGLYCERIN (NITROSTAT) 0.4 MG SL tablet, Place 1 tablet (0.4 mg total) under the tongue every 5 (five) minutes as needed for chest pain., Disp: 25 tablet, Rfl: 3   omeprazole (PRILOSEC) 40 MG capsule, Take 40 mg by mouth daily., Disp: , Rfl:    ONETOUCH ULTRA test strip, 1 each daily., Disp: , Rfl:    OZEMPIC, 0.25 OR 0.5 MG/DOSE, 2 MG/1.5ML SOPN, Inject into the skin., Disp: , Rfl:     rosuvastatin (CRESTOR) 10 MG tablet, Take 10 mg by mouth daily., Disp: , Rfl:    Allergies  Allergen Reactions   Amoxicillin Itching and Swelling    Throat closes up  Has patient had a PCN reaction causing immediate rash, facial/tongue/throat swelling, SOB or lightheadedness with hypotension: Yes Has patient had a PCN reaction causing severe rash involving mucus membranes or skin necrosis: No Has patient had a PCN reaction that required hospitalization: No Has patient had a PCN reaction occurring within the last 10 years: No  If all of the above answers are "NO", then may proceed with Cephalosporin use.   Codeine Diarrhea and Nausea Only   Vicodin [Hydrocodone-Acetaminophen] Itching    Past Medical History:  Diagnosis Date   Arthritis    CAD (coronary artery disease)    s/p CABG & caths with stenting    Diabetes (HCC) 08/2013   GERD (gastroesophageal reflux disease)    Hyperlipidemia    Hypertension    Hypothyroidism    Sleep apnea 04/2021     Past Surgical History:  Procedure Laterality Date   APPENDECTOMY     CHOLECYSTECTOMY  1982   CORONARY ANGIOPLASTY WITH STENT PLACEMENT  08/2000   stent   CORONARY ANGIOPLASTY WITH STENT PLACEMENT  06/2009   stent   CORONARY ARTERY BYPASS GRAFT  12/14/2000  FEMORAL ARTERY REPAIR  2010   post-cath   ORIF ANKLE FRACTURE Right    TUBAL LIGATION  1984   UMBILICAL HERNIA REPAIR N/A 10/22/2017   Procedure: OPEN EPIGASTRIC HERNIA REPAIR WITH MESH;  Surgeon: Lucretia Roers, MD;  Location: [facility];  Service: General;  Laterality: N/A;    Family History  Problem Relation Age of Onset   Hypertension Mother    Diabetes Mother    Hypertension Father    Diabetes Father    Heart attack Maternal Grandfather    Hypertension Paternal Grandmother    Liver disease Paternal Grandfather    Hyperlipidemia Sister    Heart attack Sister        bypass surgery   Other Daughter        meningitis    Social History   Tobacco Use   Smoking  status: Former    Packs/day: 0.25    Years: 20.00    Pack years: 5.00    Types: Cigarettes    Quit date: 10/18/2004    Years since quitting: 17.1   Smokeless tobacco: Never  Vaping Use   Vaping Use: Never used  Substance Use Topics   Alcohol use: No    Alcohol/week: 0.0 standard drinks   Drug use: No    ROS   Objective:   Vitals: BP 136/85 (BP Location: Right Arm)    Temp 98.1 F (36.7 C) (Oral)    Resp 18    SpO2 98%   Physical Exam Constitutional:      General: She is not in acute distress.    Appearance: Normal appearance. She is well-developed. She is not ill-appearing, toxic-appearing or diaphoretic.  HENT:     Head: Normocephalic and atraumatic.     Nose: Nose normal.     Mouth/Throat:     Mouth: Mucous membranes are moist.  Eyes:     General: No scleral icterus.       Right eye: No discharge.        Left eye: No discharge.     Extraocular Movements: Extraocular movements intact.  Cardiovascular:     Rate and Rhythm: Normal rate.     Heart sounds: No murmur heard.   No friction rub. No gallop.  Pulmonary:     Effort: Pulmonary effort is normal. No respiratory distress.     Breath sounds: No stridor. No wheezing, rhonchi or rales.  Chest:     Chest wall: Tenderness (focal lateral chest wall on the right mid side) present.  Skin:    General: Skin is warm and dry.  Neurological:     General: No focal deficit present.     Mental Status: She is alert and oriented to person, place, and time.  Psychiatric:        Mood and Affect: Mood normal.        Behavior: Behavior normal.    DG Ribs Unilateral W/Chest Right  Result Date: 12/21/2021 CLINICAL DATA:  Fall, right-sided rib pain EXAM: RIGHT RIBS AND CHEST - 3+ VIEW COMPARISON:  07/24/2019 FINDINGS: No displaced fracture or other bone lesions are seen involving the ribs. There is no evidence of pneumothorax or pleural effusion. Both lungs are clear. Heart size is normal. Status post median sternotomy.  IMPRESSION: No displaced fracture or other radiographic abnormality of the right ribs. Electronically Signed   By: Jearld Lesch M.D.   On: 12/21/2021 15:25     Assessment and Plan :   I have reviewed the PDMP during  this encounter.  1. Rib contusion, right, initial encounter   2. Right-sided chest wall pain   3. History of heart disease   4. Hx of CABG   5. Type 2 diabetes mellitus treated without insulin (HCC)    Patient cannot tolerate opioid pain medications.  Advised that she avoid NSAIDs due to her heart history.  Use Tylenol with tizanidine for conservative management of her rib contusion, chest wall contusion. Counseled patient on potential for adverse effects with medications prescribed/recommended today, ER and return-to-clinic precautions discussed, patient verbalized understanding.    Wallis Bamberg, [place] 12/21/21 1533

## 2021-12-21 NOTE — ED Triage Notes (Signed)
Pt reports having pain under the right breast, she think she broke some ribs, as her dog threw her into a car bumper 4 days ago. Pain increase with deep breath and when raising the arms.

## 2022-01-08 ENCOUNTER — Other Ambulatory Visit: Payer: Self-pay

## 2022-01-08 ENCOUNTER — Ambulatory Visit (INDEPENDENT_AMBULATORY_CARE_PROVIDER_SITE_OTHER): Payer: BC Managed Care – PPO | Admitting: Obstetrics & Gynecology

## 2022-01-08 ENCOUNTER — Encounter: Payer: Self-pay | Admitting: Obstetrics & Gynecology

## 2022-01-08 VITALS — BP 106/72 | HR 100 | Ht 60.0 in | Wt 147.5 lb

## 2022-01-08 DIAGNOSIS — N814 Uterovaginal prolapse, unspecified: Secondary | ICD-10-CM

## 2022-01-08 DIAGNOSIS — Z4689 Encounter for fitting and adjustment of other specified devices: Secondary | ICD-10-CM

## 2022-01-08 NOTE — Progress Notes (Signed)
Chief Complaint  ?Patient presents with  ? Pessary Check  ? ? ?Blood pressure 106/72, pulse 100, height 5' (1.524 m), weight 147 lb 8 oz (66.9 kg). ? ?Cheryl Dunn presents today for routine follow up related to her pessary.   ?She uses a Milex ring with support #5 ?She reports no vaginal discharge and no vaginal bleeding  ? ?Likert scale(1 not bothersome -5 very bothersome)  :  1 ? ?Exam reveals no undue vaginal mucosal pressure of breakdown, no discharge and no vaginal bleeding. ? ?Vaginal Epithelial Abnormality Classification System:   0 ?0    No abnormalities ?1    Epithelial erythema ?2    Granulation tissue ?3    Epithelial break or erosion, 1 cm or less ?4    Epithelial break or erosion, 1 cm or greater ? ?The pessary is removed, cleaned and replaced without difficulty.   ? ?  ICD-10-CM   ?1. Pessary maintenance, Milex ring with support #5, OF 11/2018  Z46.89   ?  ?2. Cystocele with uterine prolapse  N81.4   ?  ?  ?Recurrent BV--> recommend metro gel weekly 1 applicator ? ?Cheryl Dunn will be sen back in 4 months for continued follow up. ? ?Lazaro Arms, MD  ?01/08/2022 ?4:02 PM ? ? ? ?

## 2022-04-16 ENCOUNTER — Ambulatory Visit (INDEPENDENT_AMBULATORY_CARE_PROVIDER_SITE_OTHER): Payer: BC Managed Care – PPO | Admitting: Internal Medicine

## 2022-04-16 ENCOUNTER — Encounter: Payer: Self-pay | Admitting: Internal Medicine

## 2022-04-16 VITALS — BP 122/78 | HR 88 | Ht 60.0 in | Wt 145.0 lb

## 2022-04-16 DIAGNOSIS — I2510 Atherosclerotic heart disease of native coronary artery without angina pectoris: Secondary | ICD-10-CM | POA: Diagnosis not present

## 2022-04-16 DIAGNOSIS — Z951 Presence of aortocoronary bypass graft: Secondary | ICD-10-CM

## 2022-04-16 DIAGNOSIS — E118 Type 2 diabetes mellitus with unspecified complications: Secondary | ICD-10-CM

## 2022-04-16 DIAGNOSIS — I10 Essential (primary) hypertension: Secondary | ICD-10-CM

## 2022-04-16 DIAGNOSIS — E785 Hyperlipidemia, unspecified: Secondary | ICD-10-CM

## 2022-04-16 NOTE — Progress Notes (Unsigned)
OFFICE NOTE  Chief Complaint:  Annual follow-up  Primary Care Physician: Roe Rutherford, NP  HPI:  Cheryl Dunn is a pleasant 66 year old female who is from [place], [place], who unfortunately has a history of coronary artery disease and underwent a stent to the proximal LAD in 2001. Apparently this was difficult and the result was somewhat suboptimal. Less than one year later she was having chest pain while in cardiac rehabilitation and underwent a repeat cardiac catheterization. This demonstrated a significant bifurcation lesion at the ostial LAD and left circumflex. Based on these findings a coronary bypass was recommended. She'll LAF underwent two-vessel coronary artery bypass grafting in 2002 with a LIMA to the LAD and radial artery to the circumflex.  She has done well since that time.  Cheryl Dunn has dyslipidemia, mild hypothyroidism and mild hypertension-well controlled.  And she doesn't and she unfortunately had a complication with cardiac catheterization and had to have a patch repair of the right femoral artery. Currently she is asymptomatic and is interesting in starting care with a cardiologist in the area.  Cheryl Dunn returns today for follow-up. She feels quite well. She occasionally struggles with some sadness and has been intermittently smoking. She had quit successfully years ago around the time of her bypass surgery, but admits that she has been buying cigarettes and smoking. She has no real anginal symptoms and denies any worsening shortness of breath. She occasionally gets a cough which I feel is related to smoking.  I had the pleasure of seeing Cheryl Dunn back today in the office. Overall she is doing really well. She fortunately quit smoking this past fall and has been abstinent from cigarettes since then. She denies any chest pain or worsening shortness of breath. She has had some weight gain which is not unexpected with smoking cessation. She reported she had recent cholesterol  work done by primary care provider which is mildly elevated. She is currently only taking Crestor 5 mg daily instead of 10 mg due to some constipation. We may need to readjust this dosing based on her lab work which I will obtain. She has not needed to use any nitroglycerin.  11/09/2016  Cheryl Dunn returns today for follow-up. She has no complaints of last year although has been under significant stress. She's been traveling back and forth to [place] to care for her mother. Her weight is actually down from 163-157 pounds. She is more rarely taking Celebrex, only as needed. Recent lab work from her PCP the end of December indicated total cholesterol 219, HDL 71, triglycerides 129 and LDL 124. This is a marked increase from her last numbers. She reports that she is only taking half of her rosuvastatin (5 mg). She thought that the higher dose gave her "gas". She also is complaining of some right thigh discomfort. It seems to be more depressed when laying down and not necessarily with walking. She wondered if it was related to her prior patch angioplasty of her right femoral artery. I do not suspect she has any significant disease however it would be reasonable to reassess that with lower extremity arterial Dopplers.  11/30/2017  Cheryl Dunn was seen today in follow-up.  In the fall she underwent hernia surgery and had to stop her Plavix for 5 days prior to that.  She had no issues with that and her surgery went well.  She denies any chest pain or worsening shortness of breath.  She recently had labs from her primary care provider showed total  cholesterol 168, HDL 60, triglycerides 90, LDL-C 90, non-HDL 108.  Based on these findings she still not at goal LDL less than 70.  She has been compliant with the rosuvastatin 10 mg daily.  She has not tried higher doses but is concerned about side effects which she felt like she had on rosuvastatin, namely constipation.  This may have been related the fact she had a ventral  hernia.  She also had peripheral Dopplers of the fall which were normal.  12/08/2018  Cheryl Dunn is seen today in follow-up.  Over the past year she is done well.  She denies any chest pain or worsening shortness of breath.  She did have lab work in December 2019 and her total cholesterol is 224, HDL 71 and triglycerides 108.  Her LDL was not reported.  We will need to repeat her lipid profile.  EKG shows sinus rhythm today.  She has been having daytime fatigue.  She reports she is told that she snores very loudly.  I had her fill out an Epworth sleepiness score today.  Her score was 14 concerning for possible undiagnosed obstructive sleep apnea.  Pressure is at goal.  01/09/2021  Cheryl Dunn returns today for follow-up.  This is an annual visit for.  She continues to be asymptomatic.  She gets some rare shortness of breath.  Her PCP had switched her from glipizide to Metformin 500 mg extended release twice a day.  She also remains on rosuvastatin 10 mg daily.  Her cholesterol actually had crept up a little last year in September.  Total was at just over 205.  She is due for repeat lipids.  EKG today shows sinus rhythm with nonspecific ST and T wave changes.  PMHx:  Past Medical History:  Diagnosis Date   Arthritis    CAD (coronary artery disease)    s/p CABG & caths with stenting    Diabetes (HCC) 08/2013   GERD (gastroesophageal reflux disease)    Hyperlipidemia    Hypertension    Hypothyroidism    Sleep apnea 04/2021    Past Surgical History:  Procedure Laterality Date   APPENDECTOMY     CHOLECYSTECTOMY  1982   CORONARY ANGIOPLASTY WITH STENT PLACEMENT  08/2000   stent   CORONARY ANGIOPLASTY WITH STENT PLACEMENT  06/2009   stent   CORONARY ARTERY BYPASS GRAFT  12/14/2000   FEMORAL ARTERY REPAIR  2010   post-cath   ORIF ANKLE FRACTURE Right    TUBAL LIGATION  1984   UMBILICAL HERNIA REPAIR N/A 10/22/2017   Procedure: OPEN EPIGASTRIC HERNIA REPAIR WITH MESH;  Surgeon: Lucretia Roers, MD;  Location: [facility];  Service: General;  Laterality: N/A;    FAMHx:  Family History  Problem Relation Age of Onset   Hypertension Mother    Diabetes Mother    Hypertension Father    Diabetes Father    Heart attack Maternal Grandfather    Hypertension Paternal Grandmother    Liver disease Paternal Grandfather    Hyperlipidemia Sister    Heart attack Sister        bypass surgery   Other Daughter        meningitis    SOCHx:   reports that she quit smoking about 17 years ago. Her smoking use included cigarettes. She has a 5.00 pack-year smoking history. She has never used smokeless tobacco. She reports that she does not drink alcohol and does not use drugs.  ALLERGIES:  Allergies  Allergen Reactions  Amoxicillin Itching and Swelling    Throat closes up  Has patient had a PCN reaction causing immediate rash, facial/tongue/throat swelling, SOB or lightheadedness with hypotension: Yes Has patient had a PCN reaction causing severe rash involving mucus membranes or skin necrosis: No Has patient had a PCN reaction that required hospitalization: No Has patient had a PCN reaction occurring within the last 10 years: No  If all of the above answers are "NO", then may proceed with Cephalosporin use.   Codeine Diarrhea and Nausea Only   Vicodin [Hydrocodone-Acetaminophen] Itching    ROS: Pertinent items noted in HPI and remainder of comprehensive ROS otherwise negative.  HOME MEDS: Current Outpatient Medications  Medication Sig Dispense Refill   acetaminophen (TYLENOL) 325 MG tablet Take 2 tablets (650 mg total) by mouth every 6 (six) hours as needed. 30 tablet 0   celecoxib (CELEBREX) 200 MG capsule Take 200 mg by mouth 2 (two) times daily as needed for mild pain or moderate pain.      clopidogrel (PLAVIX) 75 MG tablet Take 75 mg by mouth daily with breakfast.     fluticasone (FLONASE) 50 MCG/ACT nasal spray Place into both nostrils.     guaiFENesin (MUCINEX PO) Take by  mouth 2 (two) times daily as needed.     levothyroxine (SYNTHROID) 125 MCG tablet Take 125 mcg by mouth daily before breakfast.     metFORMIN (GLUCOPHAGE-XR) 500 MG 24 hr tablet Take 500 mg by mouth 2 (two) times daily.     metoprolol tartrate (LOPRESSOR) 25 MG tablet Take 25 mg by mouth 2 (two) times daily.     metroNIDAZOLE (METROGEL VAGINAL) 0.75 % vaginal gel Nightly x 5 nights 70 g 5   nitroGLYCERIN (NITROSTAT) 0.4 MG SL tablet Place 1 tablet (0.4 mg total) under the tongue every 5 (five) minutes as needed for chest pain. 25 tablet 3   omeprazole (PRILOSEC) 40 MG capsule Take 40 mg by mouth daily.     ONETOUCH ULTRA test strip 1 each daily.     OZEMPIC, 0.25 OR 0.5 MG/DOSE, 2 MG/1.5ML SOPN Inject into the skin.     rosuvastatin (CRESTOR) 10 MG tablet Take 10 mg by mouth daily.     tiZANidine (ZANAFLEX) 4 MG tablet Take 1 tablet (4 mg total) by mouth at bedtime. (Patient not taking: Reported on 04/16/2022) 30 tablet 0   No current facility-administered medications for this visit.    LABS/IMAGING: No results found for this or any previous visit (from the past 48 hour(s)). No results found.  VITALS: BP 122/78   Pulse 88   Ht 5' (1.524 m)   Wt 145 lb (65.8 kg)   SpO2 98%   BMI 28.32 kg/m   EXAM: General appearance: alert and no distress Neck: no carotid bruit and no JVD Lungs: clear to auscultation bilaterally Heart: regular rate and rhythm, S1, S2 normal, no murmur, click, rub or gallop Abdomen: soft, non-tender; bowel sounds normal; no masses,  no organomegaly Extremities: extremities normal, atraumatic, no cyanosis or edema Pulses: 2+ and symmetric Skin: Skin color, texture, turgor normal. No rashes or lesions Neurologic: Grossly normal Psych: Pleasant, normal affect  EKG: Normal sinus rhythm at 100, nonspecific ST-T wave changes-personally reviewed  ASSESSMENT: Coronary artery disease status post PCI to the LAD in 2001 Two-vessel CABG for bifurcation ostial LAD and  circumflex disease in 2002 (LIMA to LAD, free radial to circumflex) Patch angioplasty to the right common femoral artery for iatrogenic injury Dyslipidemia Hypertension Diabetes type 2 Hypothyroidism  Quit smoking fall 2016  PLAN: 1.   Ms. Yochim seems to be doing well without chest pain or worsening shortness of breath.  As noted before, she has some degree of fatigue, increased daytime sleepiness, nonrestorative sleep, loud snoring, obesity and other risk factors for sleep apnea.  I recommended a sleep study but she never had it performed.  Her EPWSS is 17.  She denies any new coronary symptoms.  Her blood pressure is at goal.  Her cholesterol may be still above target.  We will repeat a lipid profile with goal LDL at less than 70. Arrange for a home sleep study. Plan follow-up with me afterwards.  Chrystie Nose, MD, [facility], FACP  [facility]  [facility]  Medical Director of the [facility] Diplomate of the American Board of Clinical Lipidology Attending Cardiologist  Direct Dial: [phone]  Fax: [phone]  Website:  [email or web address] 04/16/2022, 4:03 PM

## 2022-04-16 NOTE — Patient Instructions (Signed)
Medication Instructions:  Your physician recommends that you continue on your current medications as directed. Please refer to the Current Medication list given to you today.   Labwork: None  Testing/Procedures: None  Follow-Up: Follow up with Dr. Hilty in 1 year.   Any Other Special Instructions Will Be Listed Below (If Applicable).     If you need a refill on your cardiac medications before your next appointment, please call your pharmacy.  

## 2022-05-14 ENCOUNTER — Ambulatory Visit: Payer: BC Managed Care – PPO | Admitting: Obstetrics & Gynecology

## 2022-06-01 IMAGING — MG MM DIGITAL SCREENING BILAT W/ TOMO AND CAD
8 series · 8 of 24 positions shown · non-contrast
Comparison: Previous exam(s).

CLINICAL DATA: Screening.

EXAM:
DIGITAL SCREENING BILATERAL MAMMOGRAM WITH TOMOSYNTHESIS AND CAD
TECHNIQUE: Bilateral screening digital craniocaudal and mediolateral oblique
mammograms were obtained. Bilateral screening digital breast
tomosynthesis was performed. The images were evaluated with
computer-aided detection.

[R MLO synth-2D]
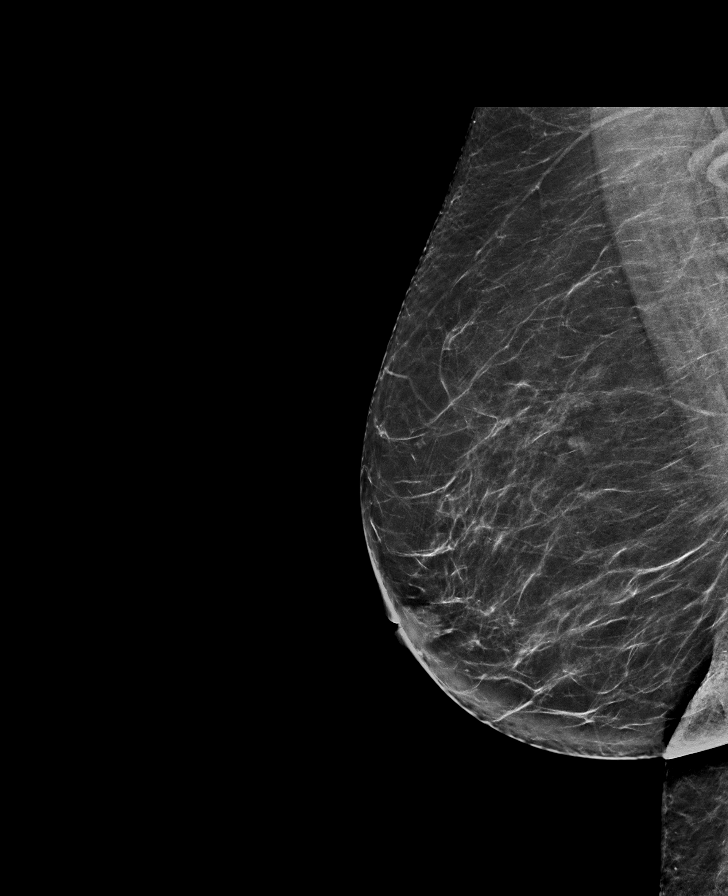

[L CC synth-2D]
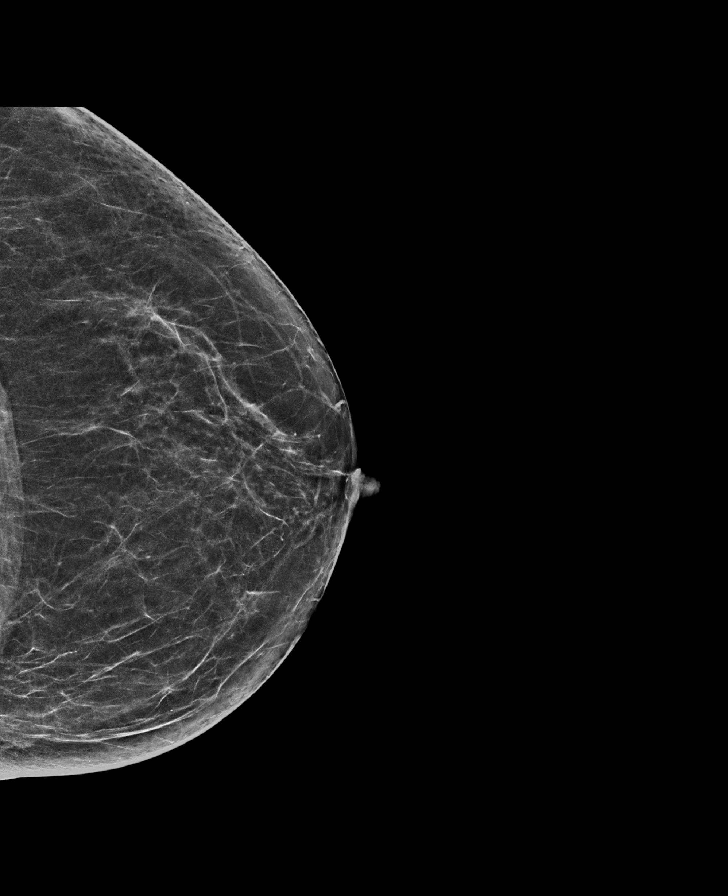

[L MLO synth-2D]
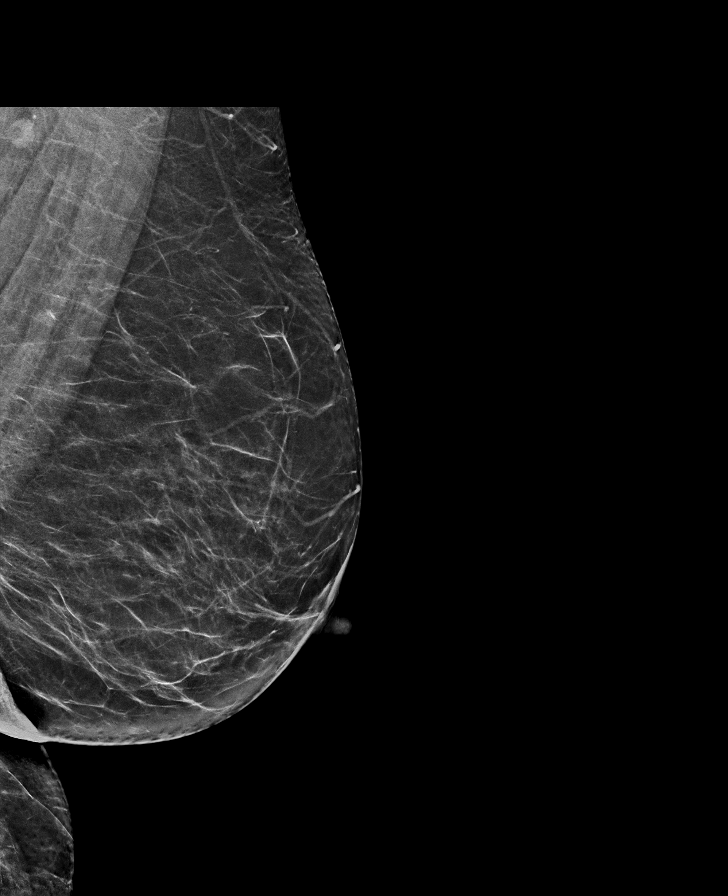

[R CC synth-2D]
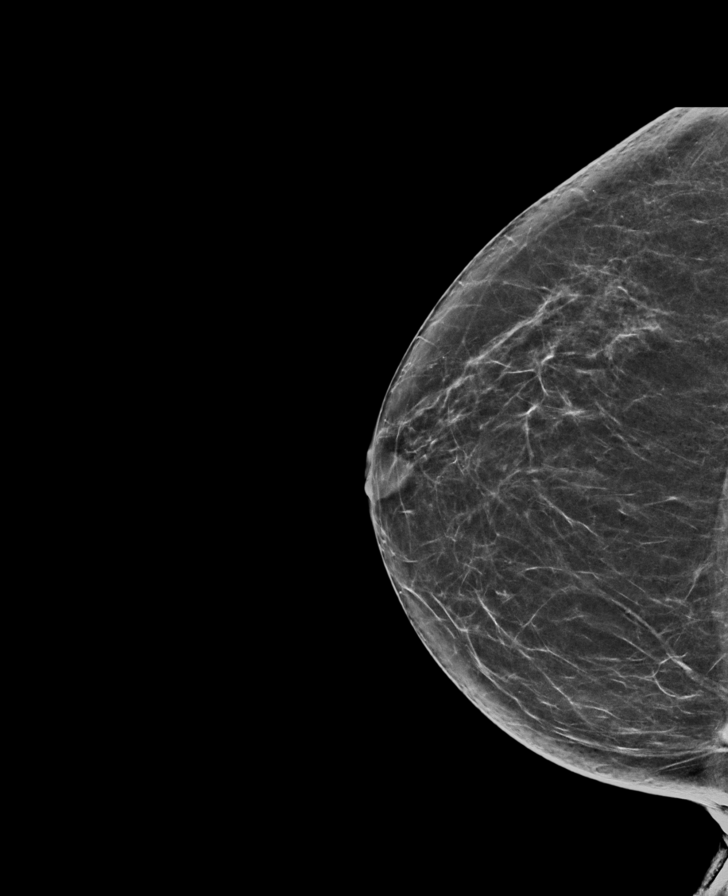

[R MLO tomo · tomo slice 38/75.0]
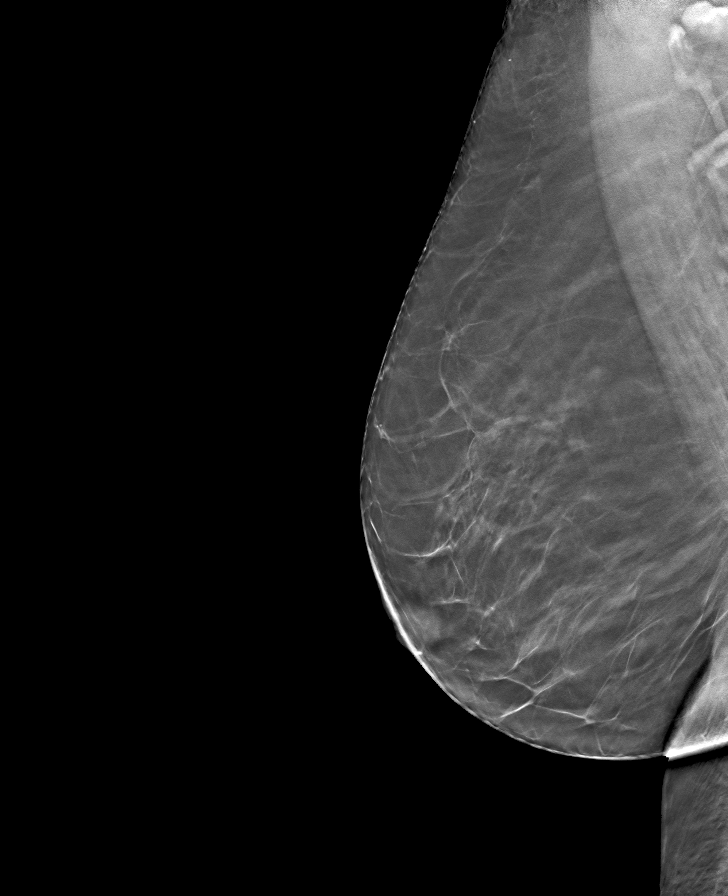

[L MLO tomo · tomo slice 36/71.0]
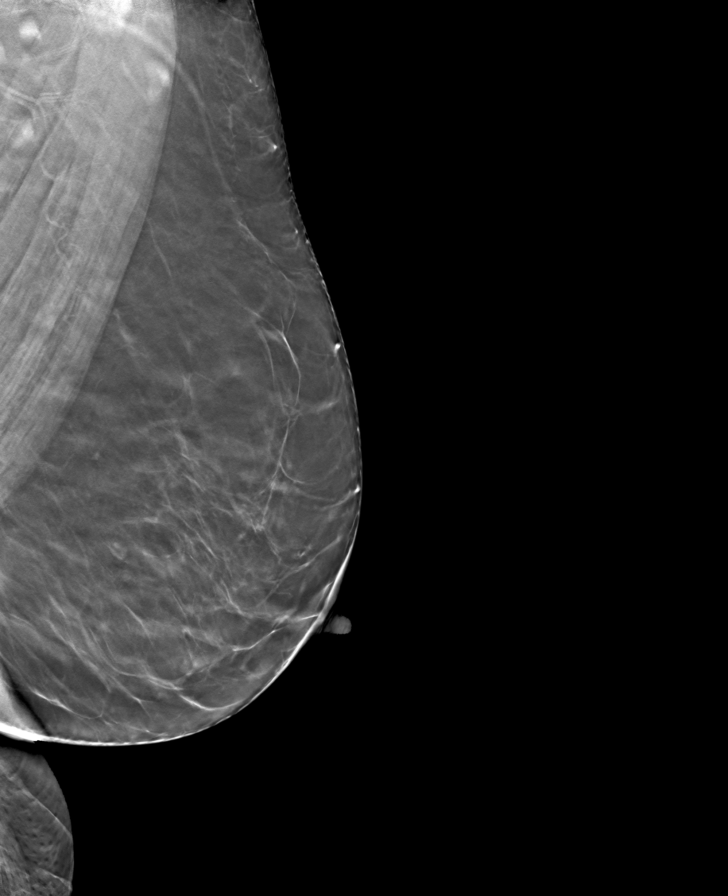

[L CC tomo · tomo slice 33/65.0]
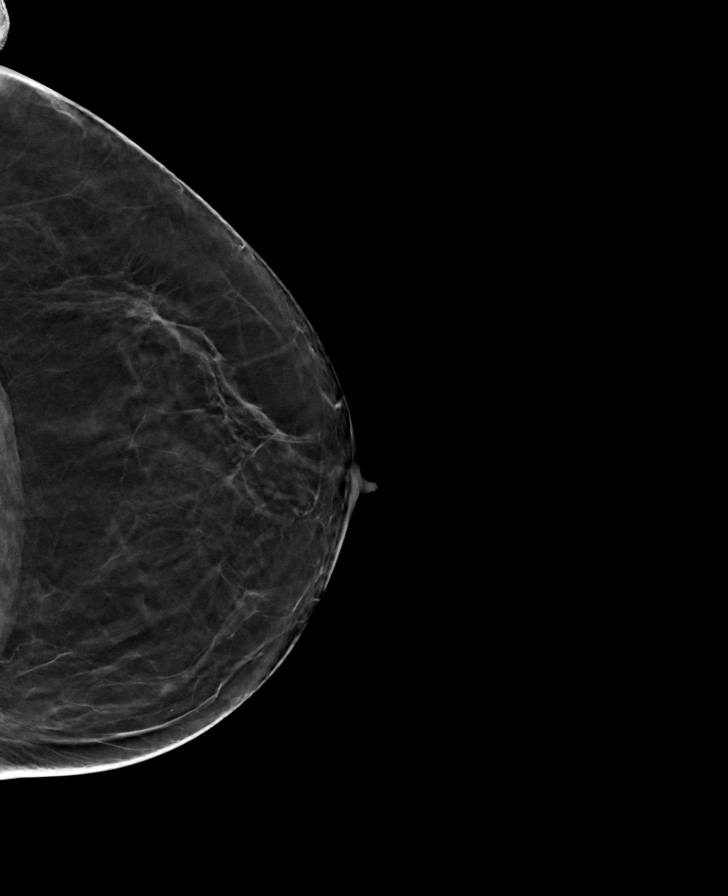

[R CC tomo · tomo slice 34/67.0]
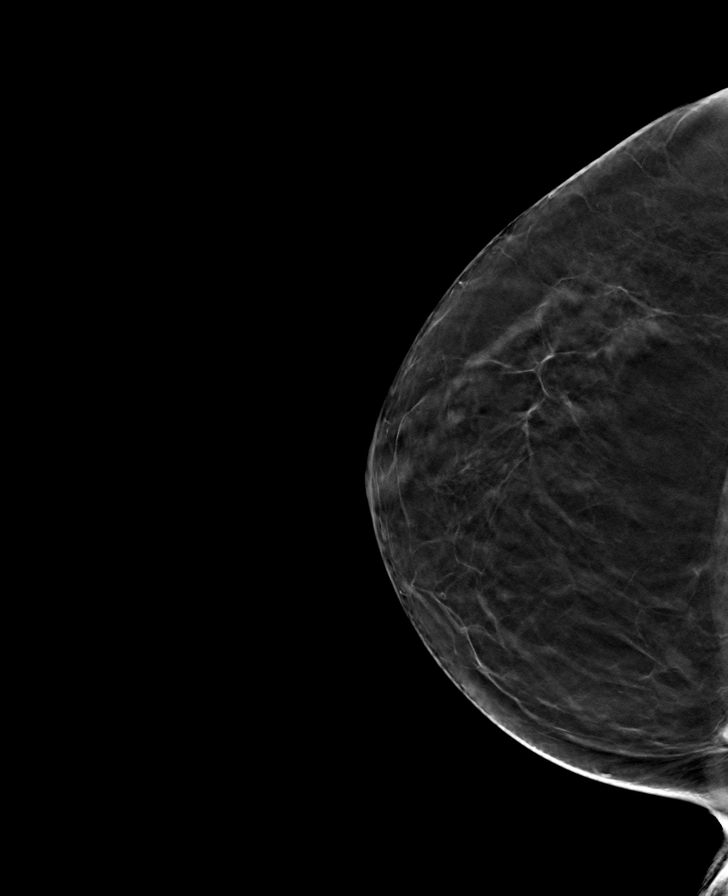

[8 of 24 positions shown; findings below may reference images not displayed]

ACR Breast Density Category b: There are scattered areas of
fibroglandular density.
FINDINGS: There are no findings suspicious for malignancy.
IMPRESSION: No mammographic evidence of malignancy. A result letter of this
screening mammogram will be mailed directly to the patient.

RECOMMENDATION:
Screening mammogram in one year. (Code:51-O-LD2)

BI-RADS CATEGORY  1: Negative.

## 2022-06-04 ENCOUNTER — Ambulatory Visit: Payer: BC Managed Care – PPO | Admitting: Obstetrics & Gynecology

## 2022-06-05 ENCOUNTER — Encounter: Payer: Self-pay | Admitting: Obstetrics & Gynecology

## 2022-06-05 ENCOUNTER — Ambulatory Visit (INDEPENDENT_AMBULATORY_CARE_PROVIDER_SITE_OTHER): Payer: BC Managed Care – PPO | Admitting: Obstetrics & Gynecology

## 2022-06-05 VITALS — BP 124/81 | HR 91 | Ht 60.0 in | Wt 147.5 lb

## 2022-06-05 DIAGNOSIS — B9689 Other specified bacterial agents as the cause of diseases classified elsewhere: Secondary | ICD-10-CM

## 2022-06-05 DIAGNOSIS — N814 Uterovaginal prolapse, unspecified: Secondary | ICD-10-CM | POA: Diagnosis not present

## 2022-06-05 DIAGNOSIS — Z4689 Encounter for fitting and adjustment of other specified devices: Secondary | ICD-10-CM | POA: Diagnosis not present

## 2022-06-05 DIAGNOSIS — N760 Acute vaginitis: Secondary | ICD-10-CM

## 2022-06-05 NOTE — Progress Notes (Signed)
Chief Complaint  Patient presents with   Pessary Check    Vaginal discharge and odor    Blood pressure 124/81, pulse 91, height 5' (1.524 m), weight 147 lb 8 oz (66.9 kg).  Cheryl Dunn presents today for routine follow up related to her pessary.   She uses a Milex ring with support #5 She reports moderate vaginal discharge and no vaginal bleeding   Likert scale(1 not bothersome -5 very bothersome)  :  2  Exam reveals no undue vaginal mucosal pressure of breakdown, moderate discharge and no vaginal bleeding.  Vaginal Epithelial Abnormality Classification System:   0 0    No abnormalities 1    Epithelial erythema 2    Granulation tissue 3    Epithelial break or erosion, 1 cm or less 4    Epithelial break or erosion, 1 cm or greater  The pessary is removed, cleaned and replaced without difficulty.      ICD-10-CM   1. Pessary maintenance, Milex ring with support #5, OF 11/2018  Z46.89     2. Cystocele with uterine prolapse  N81.4     3. BV (bacterial vaginosis): chronic with the pessary  N76.0    B96.89        [name] will be sen back in 2 months for continued follow up. She will continue metro gel weekly and I will decrease her follow up interval to see if that makes a difference  Lazaro Arms, MD  06/05/2022 9:20 AM

## 2022-07-07 ENCOUNTER — Encounter: Payer: Self-pay | Admitting: Obstetrics & Gynecology

## 2022-07-16 ENCOUNTER — Other Ambulatory Visit (HOSPITAL_COMMUNITY): Payer: Self-pay | Admitting: Adult Health Nurse Practitioner

## 2022-07-16 DIAGNOSIS — Z1231 Encounter for screening mammogram for malignant neoplasm of breast: Secondary | ICD-10-CM

## 2022-07-23 ENCOUNTER — Other Ambulatory Visit: Payer: Self-pay

## 2022-07-23 ENCOUNTER — Ambulatory Visit (HOSPITAL_COMMUNITY)
Admission: RE | Admit: 2022-07-23 | Discharge: 2022-07-23 | Disposition: A | Discharge status: Home / Self Care | Payer: BC Managed Care – PPO | Source: Ambulatory Visit | Attending: Adult Health Nurse Practitioner | Admitting: Adult Health Nurse Practitioner

## 2022-07-23 DIAGNOSIS — Z1231 Encounter for screening mammogram for malignant neoplasm of breast: Secondary | ICD-10-CM | POA: Insufficient documentation

## 2022-07-23 MED ORDER — NITROGLYCERIN 0.4 MG SL SUBL: 0.4000 mg | SUBLINGUAL_TABLET | SUBLINGUAL | 3 refills | Status: DC | PRN

## 2022-08-04 ENCOUNTER — Encounter: Payer: Self-pay | Admitting: Obstetrics & Gynecology

## 2022-08-04 ENCOUNTER — Ambulatory Visit (INDEPENDENT_AMBULATORY_CARE_PROVIDER_SITE_OTHER): Payer: BC Managed Care – PPO | Admitting: Obstetrics & Gynecology

## 2022-08-04 VITALS — Ht 60.0 in | Wt 149.0 lb

## 2022-08-04 DIAGNOSIS — Z4689 Encounter for fitting and adjustment of other specified devices: Secondary | ICD-10-CM

## 2022-08-04 DIAGNOSIS — N814 Uterovaginal prolapse, unspecified: Secondary | ICD-10-CM | POA: Diagnosis not present

## 2022-08-04 NOTE — Progress Notes (Signed)
Chief Complaint  Patient presents with   Pessary Check    Height 5' (1.524 m), weight 149 lb (67.6 kg).  Cheryl Dunn presents today for routine follow up related to her pessary.   She uses a Milex rign with support #5 She reports no vaginal discharge and no vaginal bleeding   Likert scale(1 not bothersome -5 very bothersome)  :  1  Exam reveals no undue vaginal mucosal pressure of breakdown, no discharge and no vaginal bleeding.  Vaginal Epithelial Abnormality Classification System:   0 0    No abnormalities 1    Epithelial erythema 2    Granulation tissue 3    Epithelial break or erosion, 1 cm or less 4    Epithelial break or erosion, 1 cm or greater  The pessary is removed, cleaned and replaced without difficulty.      ICD-10-CM   1. Pessary maintenance, Milex ring with support #5, OF 11/2018  Z46.89     2. Cystocele with uterine prolapse  N81.4        [name] will be sen back in 4 months for continued follow up.  Florian Buff, MD  08/04/2022 4:22 PM

## 2022-08-06 ENCOUNTER — Ambulatory Visit: Payer: BC Managed Care – PPO | Admitting: Obstetrics & Gynecology

## 2022-08-26 ENCOUNTER — Ambulatory Visit
Admission: EM | Admit: 2022-08-26 | Discharge: 2022-08-26 | Disposition: A | Discharge status: Home / Self Care | Payer: BC Managed Care – PPO | Attending: Family Medicine | Admitting: Family Medicine

## 2022-08-26 DIAGNOSIS — J069 Acute upper respiratory infection, unspecified: Secondary | ICD-10-CM | POA: Insufficient documentation

## 2022-08-26 DIAGNOSIS — Z1152 Encounter for screening for COVID-19: Secondary | ICD-10-CM | POA: Insufficient documentation

## 2022-08-26 DIAGNOSIS — R0789 Other chest pain: Secondary | ICD-10-CM | POA: Diagnosis present

## 2022-08-26 LAB — RESP PANEL BY RT-PCR (FLU A&B, COVID) ARPGX2
Influenza A by PCR: NEGATIVE
Influenza B by PCR: NEGATIVE
SARS Coronavirus 2 by RT PCR: NEGATIVE

## 2022-08-26 MED ORDER — ALBUTEROL SULFATE HFA 108 (90 BASE) MCG/ACT IN AERS: 2.0000 | INHALATION_SPRAY | RESPIRATORY_TRACT | 0 refills | Status: AC | PRN

## 2022-08-26 MED ORDER — METHYLPREDNISOLONE SODIUM SUCC 125 MG IJ SOLR
60.0000 mg | Freq: Once | INTRAMUSCULAR | Status: AC
  Administered 2022-08-26: 60 mg via INTRAMUSCULAR

## 2022-08-26 MED ORDER — PROMETHAZINE-DM 6.25-15 MG/5ML PO SYRP: 5.0000 mL | ORAL_SOLUTION | Freq: Four times a day (QID) | ORAL | 0 refills | Status: AC | PRN

## 2022-08-26 NOTE — ED Triage Notes (Signed)
Pt reports chest congestion,cough, sinus pressure, clogged ears and chills x 3 days. Claritin and Mucinex gave some relief at the beginning.

## 2022-08-26 NOTE — ED Provider Notes (Signed)
[facility]    CSN: [id number] Arrival date & time: 08/26/22  4259      History   Chief Complaint Chief Complaint  Patient presents with   Cough    HPI Cheryl Dunn is a 66 y.o. female.   Presenting today with 3-day history of sinus pressure, nasal congestion, ear pressure, chills, fatigue, cough, chest congestion and chest tightness.  Denies chest pain, shortness of breath, known fevers, abdominal pain, nausea vomiting or diarrhea.  States she was flying home from [place] yesterday and there were a lot of sick people around.  Has been taking Claritin, Mucinex, Flonase with minimal relief of symptoms.  History of seasonal allergies, no known history of chronic pulmonary disease though states she is prone to walking pneumonia, pleurisy and bronchitis.    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/08/2013   Dyslipidemia 09/08/2013   HTN (hypertension) 09/08/2013   DM2 (diabetes mellitus, type 2) ([place]) 09/08/2013   Hypothyroidism 09/08/2013    Past Surgical History:  Procedure Laterality Date   APPENDECTOMY     CHOLECYSTECTOMY  1982   CORONARY ANGIOPLASTY WITH STENT PLACEMENT  08/2000   stent   CORONARY ANGIOPLASTY WITH STENT PLACEMENT  06/2009   stent   CORONARY ARTERY BYPASS GRAFT  12/14/2000   FEMORAL ARTERY REPAIR  2010   post-cath   ORIF ANKLE FRACTURE Right    TUBAL LIGATION  5638   UMBILICAL HERNIA REPAIR N/A 10/22/2017   Procedure: OPEN EPIGASTRIC HERNIA REPAIR WITH MESH;  Surgeon: Virl Cagey, MD;  Location: [facility];   Service: General;  Laterality: N/A;    OB History     Gravida  2   Para  2   Term  2   Preterm      AB      Living  1      SAB      IAB      Ectopic      Multiple      Live Births  2            Home Medications    Prior to Admission medications   Medication Sig Start Date End Date Taking? Authorizing Provider  albuterol (VENTOLIN HFA) 108 (90 Base) MCG/ACT inhaler Inhale 2 puffs into the lungs every 4 (four) hours as needed for wheezing or shortness of breath. 08/26/22  Yes Volney American, PA-C  promethazine-dextromethorphan (PROMETHAZINE-DM) 6.25-15 MG/5ML syrup Take 5 mLs by mouth 4 (four) times daily as needed. 08/26/22  Yes Volney American, PA-C  celecoxib (CELEBREX) 200 MG capsule Take 200 mg by mouth daily.    [provider]  clopidogrel (PLAVIX) 75 MG tablet Take 75 mg by mouth daily with breakfast.    [provider]  fluticasone (FLONASE) 50 MCG/ACT nasal spray Place into both nostrils. 09/11/20   [provider]  levothyroxine (SYNTHROID) 125 MCG tablet Take 125 mcg by mouth daily before breakfast.    [provider]  metFORMIN (GLUCOPHAGE-XR) 500 MG 24 hr tablet Take 500 mg by mouth 2 (two) times daily. 11/28/20   [provider]  metoprolol tartrate (LOPRESSOR) 25 MG tablet Take 25 mg by mouth 2 (two) times daily.    [provider]  metroNIDAZOLE (METROGEL VAGINAL) 0.75 % vaginal gel Nightly x 5 nights 10/09/21   Lazaro Arms, MD  nitroGLYCERIN (NITROSTAT) 0.4 MG SL tablet Place 1 tablet (0.4 mg total) under the tongue every 5 (five) minutes as needed for chest pain. 07/23/22   Hilty, Lisette Abu, MD  omeprazole (PRILOSEC) 40 MG capsule Take 40 mg by mouth daily. 09/05/21   [provider]  [facility] ULTRA test strip 1 each daily. 09/11/20   [provider]  OZEMPIC, 0.25 OR 0.5 MG/DOSE, 2 MG/1.5ML SOPN Inject into the skin. 04/26/21   [provider]   rosuvastatin (CRESTOR) 10 MG tablet Take 10 mg by mouth daily.    [provider]    Family History Family History  Problem Relation Age of Onset   Hypertension Mother    Diabetes Mother    Hypertension Father    Diabetes Father    Heart attack Maternal Grandfather    Hypertension Paternal Grandmother    Liver disease Paternal Grandfather    Hyperlipidemia Sister    Heart attack Sister        bypass surgery   Other Daughter        meningitis    Social History Social History   Tobacco Use   Smoking status: Former    Packs/day: 0.25    Years: 20.00    Total pack years: 5.00    Types: Cigarettes    Quit date: 10/18/2004    Years since quitting: 17.8   Smokeless tobacco: Never  Vaping Use   Vaping Use: Never used  Substance Use Topics   Alcohol use: No    Alcohol/week: 0.0 standard drinks of alcohol   Drug use: No     Allergies   Amoxicillin, Codeine, and Vicodin [hydrocodone-acetaminophen]   Review of Systems Review of Systems PER HPI  Physical Exam Triage Vital Signs ED Triage Vitals  Enc Vitals Group     BP 08/26/22 1036 131/83     Pulse Rate 08/26/22 1036 (!) 106     Resp 08/26/22 1036 17     Temp 08/26/22 1036 98.9 F (37.2 C)     Temp Source 08/26/22 1036 Oral     SpO2 08/26/22 1036 98 %     Weight --      Height --      Head Circumference --      Peak Flow --      Pain Score 08/26/22 1038 0     Pain Loc --      Pain Edu? --      Excl. in GC? --    No data found.  Updated Vital Signs BP 131/83 (BP Location: Right Arm)   Pulse (!) 106   Temp 98.9 F (37.2 C) (Oral)   Resp 17   SpO2 98%   Visual Acuity Right Eye Distance:   Left Eye Distance:   Bilateral Distance:    Right Eye Near:   Left Eye Near:    Bilateral Near:     Physical Exam Vitals and nursing note reviewed.  Constitutional:      Appearance: Normal appearance. She is not ill-appearing.  HENT:     Head: Atraumatic.     Right  Ear: Tympanic membrane  normal.     Left Ear: Tympanic membrane normal.     Nose: Rhinorrhea present.     Mouth/Throat:     Mouth: Mucous membranes are moist.     Pharynx: Posterior oropharyngeal erythema present.  Eyes:     Extraocular Movements: Extraocular movements intact.     Conjunctiva/sclera: Conjunctivae normal.  Cardiovascular:     Rate and Rhythm: Normal rate and regular rhythm.     Heart sounds: Normal heart sounds.  Pulmonary:     Effort: Pulmonary effort is normal.     Breath sounds: Normal breath sounds. No wheezing or rales.  Musculoskeletal:        General: Normal range of motion.     Cervical back: Normal range of motion and neck supple.  Skin:    General: Skin is warm and dry.  Neurological:     Mental Status: She is alert and oriented to person, place, and time.     Motor: No weakness.     Gait: Gait normal.  Psychiatric:        Mood and Affect: Mood normal.        Thought Content: Thought content normal.        Judgment: Judgment normal.    UC Treatments / Results  Labs (all labs ordered are listed, but only abnormal results are displayed) Labs Reviewed  RESP PANEL BY RT-PCR (FLU A&B, COVID) [id number]    EKG   Radiology No results found.  Procedures Procedures (including critical care time)  Medications Ordered in UC Medications  methylPREDNISolone sodium succinate (SOLU-MEDROL) 125 mg/2 mL injection 60 mg (has no administration in time range)    Initial Impression / Assessment and Plan / UC Course  I have reviewed the triage vital signs and the nursing notes.  Pertinent labs & imaging results that were available during my care of the patient were reviewed by me and considered in my medical decision making (see chart for details).     Mildly tachycardic in triage, otherwise vital signs reassuring.  Given her history of bronchitis and pleurisy as well as her developing symptoms of chest tightness, will treat with IM Solu-Medrol, Phenergan DM, albuterol inhaler and  supportive over-the-counter medications and home care.  Return for any worsening symptoms.  Respiratory panel pending. Final Clinical Impressions(s) / UC Diagnoses   Final diagnoses:  Viral URI with cough  Chest tightness   Discharge Instructions   None    ED Prescriptions     Medication Sig Dispense Auth. Provider   promethazine-dextromethorphan (PROMETHAZINE-DM) 6.25-15 MG/5ML syrup Take 5 mLs by mouth 4 (four) times daily as needed. 100 mL [name], PA-C   albuterol (VENTOLIN HFA) 108 (90 Base) MCG/ACT inhaler Inhale 2 puffs into the lungs every 4 (four) hours as needed for wheezing or shortness of breath. 18 g [name], [place]      PDMP not reviewed this encounter.   [name], [place] 08/26/22 1103

## 2022-11-13 IMAGING — DX DG RIBS W/ CHEST 3+V*R*
3 series · 3 of 3 positions shown · non-contrast
Comparison: 07/24/2019

CLINICAL DATA: Fall, right-sided rib pain

EXAM:
RIGHT RIBS AND CHEST - 3+ VIEW

[chest pa]
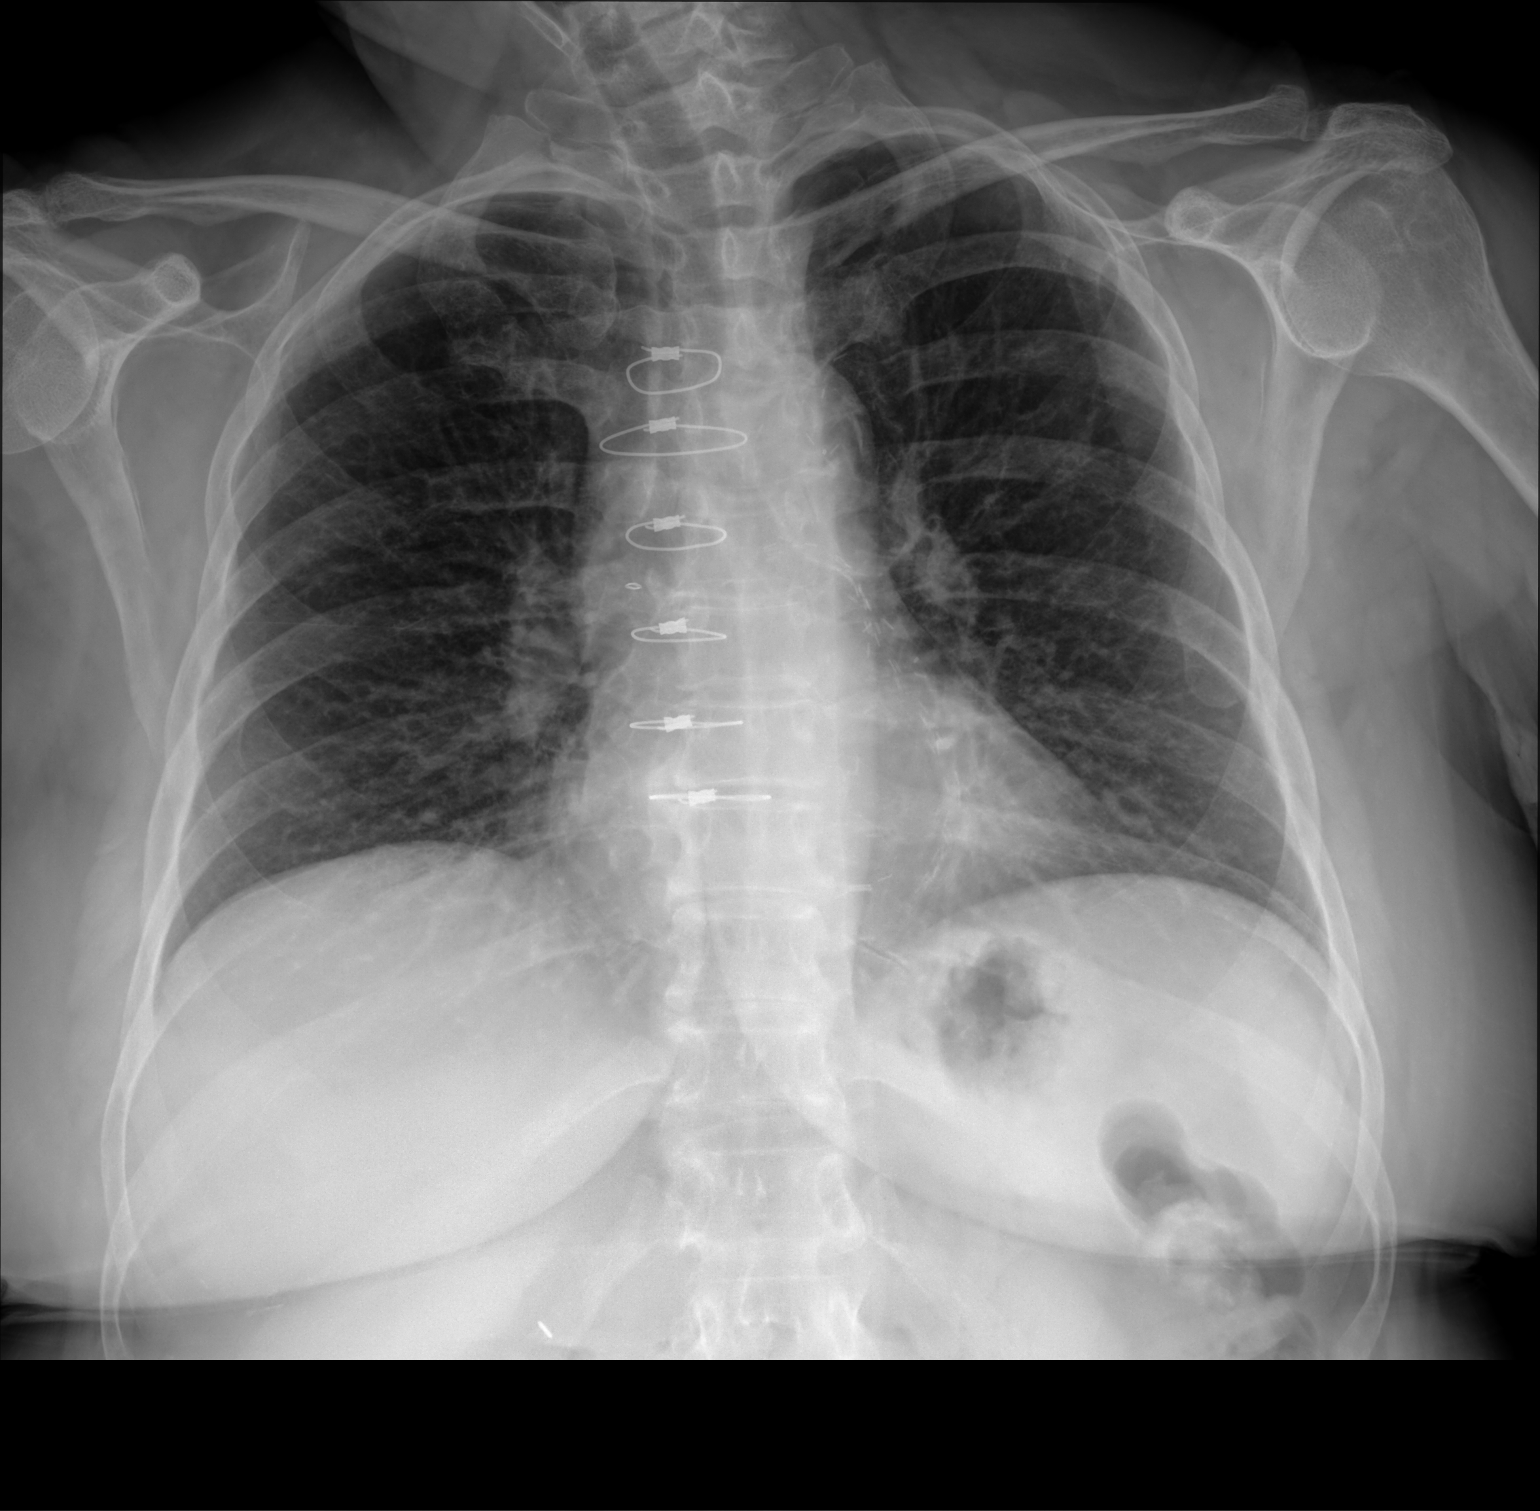

[hemithorax (ribs) ap]
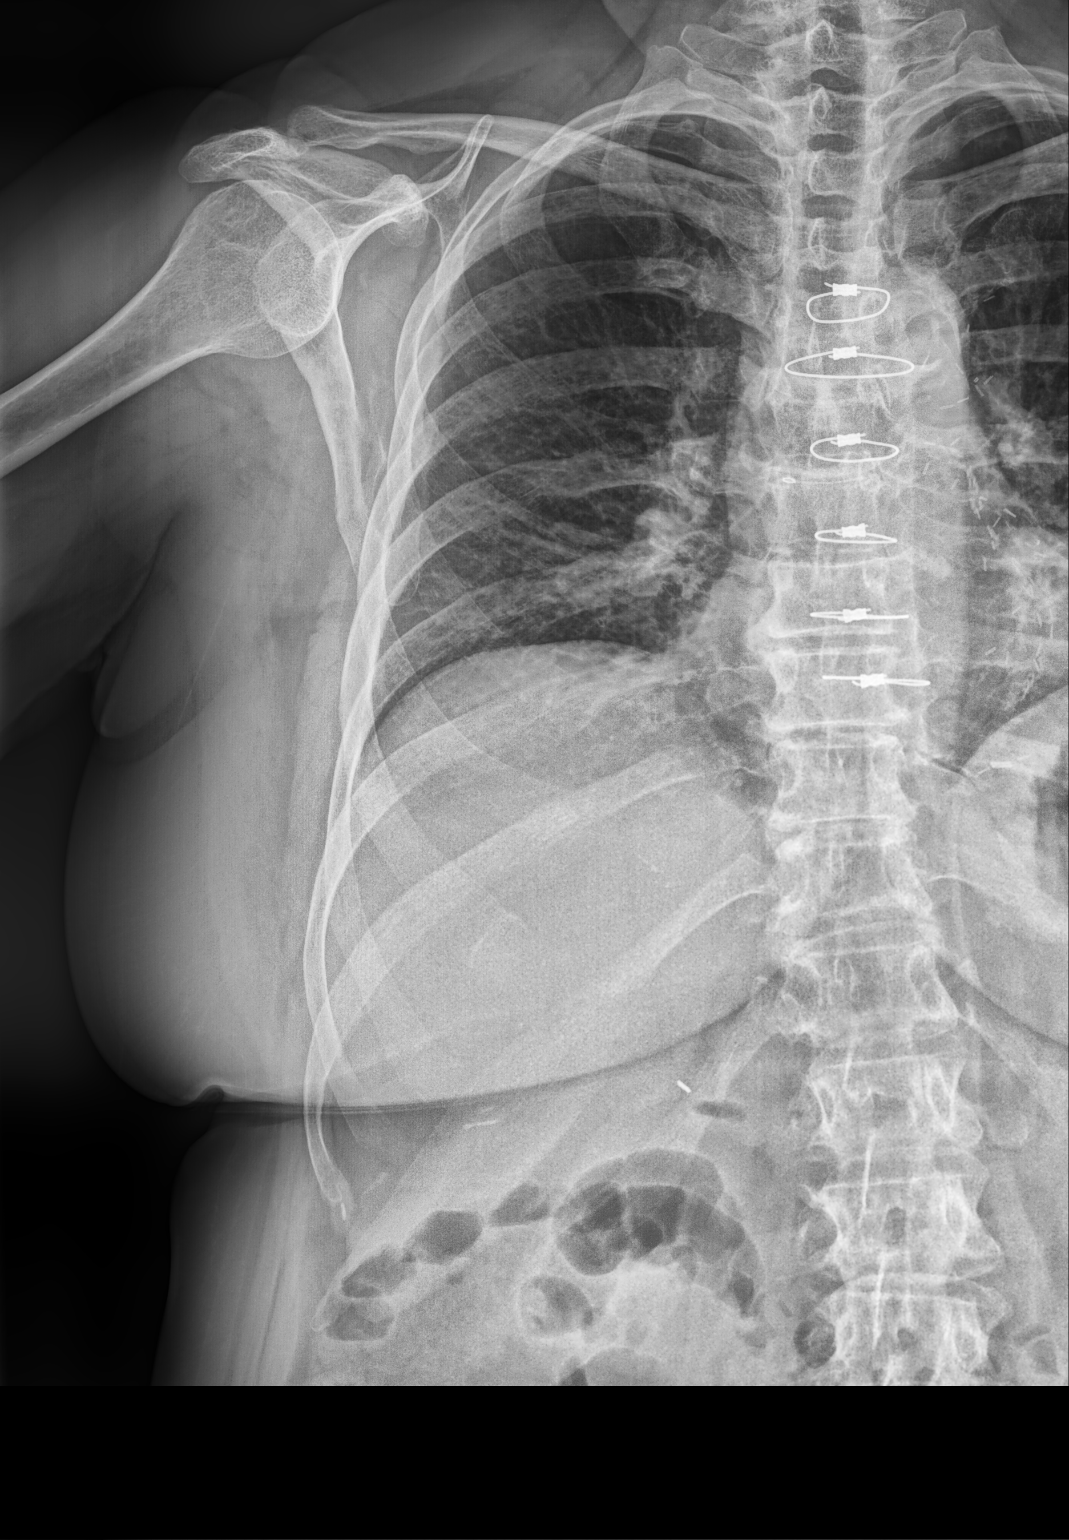

[hemithorax (ribs) mlo]
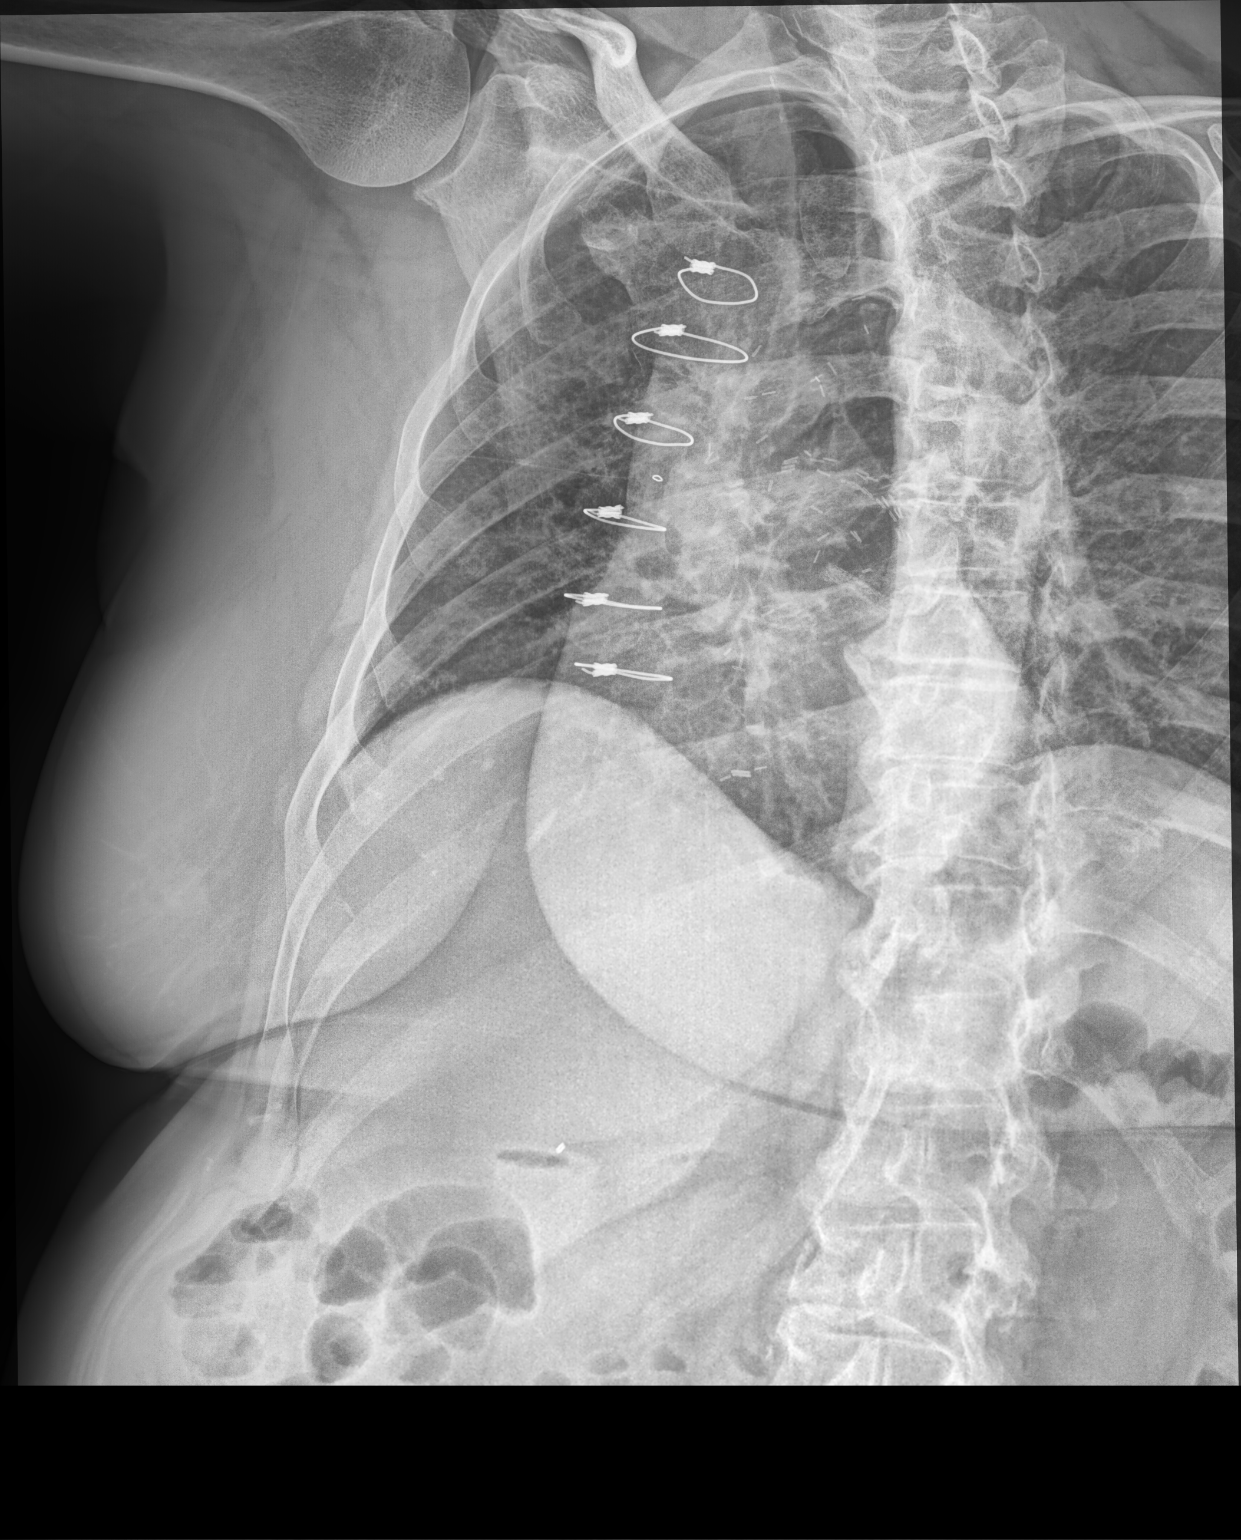

[3 of 3 positions shown; findings below may reference images not displayed]

FINDINGS: No displaced fracture or other bone lesions are seen involving the
ribs. There is no evidence of pneumothorax or pleural effusion. Both
lungs are clear. Heart size is normal. Status post median
sternotomy.
IMPRESSION: No displaced fracture or other radiographic abnormality of the right
ribs.

## 2022-11-19 ENCOUNTER — Ambulatory Visit (INDEPENDENT_AMBULATORY_CARE_PROVIDER_SITE_OTHER): Payer: BC Managed Care – PPO | Admitting: Obstetrics & Gynecology

## 2022-11-19 ENCOUNTER — Encounter: Payer: Self-pay | Admitting: Obstetrics & Gynecology

## 2022-11-19 VITALS — BP 134/83 | HR 93 | Ht 60.0 in | Wt 150.0 lb

## 2022-11-19 DIAGNOSIS — N814 Uterovaginal prolapse, unspecified: Secondary | ICD-10-CM

## 2022-11-19 DIAGNOSIS — Z4689 Encounter for fitting and adjustment of other specified devices: Secondary | ICD-10-CM | POA: Diagnosis not present

## 2022-11-19 NOTE — Progress Notes (Signed)
Chief Complaint  Patient presents with   Pessary Check    Blood pressure 134/83, pulse 93, height 5' (1.524 m), weight 150 lb (68 kg).  Cheryl Dunn presents today for routine follow up related to her pessary.   She uses a Milex ring with support #5 She reports am only vaginal discharge and am  pink in her urine  Likert scale(1 not bothersome -5 very bothersome)  :  2  Exam reveals no undue vaginal mucosal pressure of breakdown, little discharge and no vaginal bleeding.  Vaginal Epithelial Abnormality Classification System:   0 0    No abnormalities 1    Epithelial erythema 2    Granulation tissue 3    Epithelial break or erosion, 1 cm or less 4    Epithelial break or erosion, 1 cm or greater  The pessary is removed, cleaned and replaced without difficulty.      ICD-10-CM   1. Pessary maintenance, Milex ring with support #5, OF 11/2018  Z46.89     2. Cystocele with uterine prolapse  N81.4        [name] will be sen back in 4 months for continued follow up.  Florian Buff, MD  11/19/2022 9:54 AM

## 2023-03-18 ENCOUNTER — Ambulatory Visit: Payer: BC Managed Care – PPO | Admitting: Obstetrics & Gynecology

## 2023-03-25 ENCOUNTER — Ambulatory Visit (INDEPENDENT_AMBULATORY_CARE_PROVIDER_SITE_OTHER): Payer: BC Managed Care – PPO | Admitting: Obstetrics & Gynecology

## 2023-03-25 ENCOUNTER — Encounter: Payer: Self-pay | Admitting: Obstetrics & Gynecology

## 2023-03-25 VITALS — BP 125/80 | HR 86 | Ht 60.0 in | Wt 144.0 lb

## 2023-03-25 DIAGNOSIS — Z4689 Encounter for fitting and adjustment of other specified devices: Secondary | ICD-10-CM

## 2023-03-25 DIAGNOSIS — N814 Uterovaginal prolapse, unspecified: Secondary | ICD-10-CM | POA: Diagnosis not present

## 2023-03-25 NOTE — Progress Notes (Signed)
Chief Complaint  Patient presents with   Pessary Check    Blood pressure 125/80, pulse 86, height 5' (1.524 m), weight 144 lb (65.3 kg).  Cheryl Dunn presents today for routine follow up related to her pessary.   She uses a Milex ring with support #5 She reports no vaginal discharge and no vaginal bleeding   Likert scale(1 not bothersome -5 very bothersome)  :  1  Exam reveals no undue vaginal mucosal pressure of breakdown, no discharge and no vaginal bleeding.  Vaginal Epithelial Abnormality Classification System:   0 0    No abnormalities 1    Epithelial erythema 2    Granulation tissue 3    Epithelial break or erosion, 1 cm or less 4    Epithelial break or erosion, 1 cm or greater  The pessary is removed, cleaned and replaced without difficulty.      ICD-10-CM   1. Pessary maintenance, Milex ring with support #5, OF 11/2018  Z46.89     2. Cystocele with uterine prolapse  N81.4        Garie Barlow will be sen back in 4 months for continued follow up.  Lazaro Arms, MD  03/25/2023 4:13 PM

## 2023-05-11 NOTE — Progress Notes (Unsigned)
   Cardiology Office Note    Date:  05/11/2023  ID:  Jennier, Fourman May 27, 1956, MRN [id number] Cardiologist: None    History of Present Illness:    Cheryl Dunn is a 67 y.o. female with past medical history of CAD (s/p CABG in 2002 with LIMA-LAD and RA to LCx, patent grafts by cath in 2010), PAD, HTN, HLD, Type 2 DM and hypothyroidism who presents to the office today for annual follow-up.     ROS: ***  Studies Reviewed:   EKG: EKG is*** ordered today and demonstrates ***   EKG Interpretation Date/Time:    Ventricular Rate:    PR Interval:    QRS Duration:    QT Interval:    QTC Calculation:   R Axis:      Text Interpretation:         Cardiac Catheterization: 2010    Risk Assessment/Calculations:   {Does this patient have ATRIAL FIBRILLATION?:[phone]} No BP recorded.  {Refresh Note OR Click here to enter BP  :1}***         Physical Exam:   VS:  There were no vitals taken for this visit.   Wt Readings from Last 3 Encounters:  03/25/23 144 lb (65.3 kg)  11/19/22 150 lb (68 kg)  08/04/22 149 lb (67.6 kg)     GEN: Well nourished, well developed in no acute distress NECK: No JVD; No carotid bruits CARDIAC: ***RRR, no murmurs, rubs, gallops RESPIRATORY:  Clear to auscultation without rales, wheezing or rhonchi  ABDOMEN: Appears non-distended. No obvious abdominal masses. EXTREMITIES: No clubbing or cyanosis. No edema.  Distal pedal pulses are 2+ bilaterally.   Assessment and Plan:      {Are you ordering a CV Procedure (e.g. stress test, cath, DCCV, TEE, etc)?   Press F2        :[id number]}   Signed, Ellsworth Lennox, PA-C

## 2023-05-12 ENCOUNTER — Ambulatory Visit: Payer: BC Managed Care – PPO | Attending: Student | Admitting: Student

## 2023-05-12 ENCOUNTER — Encounter: Payer: Self-pay | Admitting: Student

## 2023-05-12 ENCOUNTER — Encounter: Payer: Self-pay | Admitting: *Deleted

## 2023-05-12 VITALS — BP 128/74 | HR 78 | Ht 60.0 in | Wt 148.4 lb

## 2023-05-12 DIAGNOSIS — I10 Essential (primary) hypertension: Secondary | ICD-10-CM

## 2023-05-12 DIAGNOSIS — E785 Hyperlipidemia, unspecified: Secondary | ICD-10-CM

## 2023-05-12 DIAGNOSIS — I2510 Atherosclerotic heart disease of native coronary artery without angina pectoris: Secondary | ICD-10-CM

## 2023-05-12 DIAGNOSIS — R0609 Other forms of dyspnea: Secondary | ICD-10-CM | POA: Diagnosis not present

## 2023-05-12 DIAGNOSIS — E118 Type 2 diabetes mellitus with unspecified complications: Secondary | ICD-10-CM

## 2023-05-12 NOTE — Patient Instructions (Signed)
Medication Instructions:  Your physician recommends that you continue on your current medications as directed. Please refer to the Current Medication list given to you today.  Please Hold Metformin and Ozempic the morning of your Stress Test.   *If you need a refill on your cardiac medications before your next appointment, please call your pharmacy*   Lab Work: NONE   If you have labs (blood work) drawn today and your tests are completely normal, you will receive your results only by: MyChart Message (if you have MyChart) OR A paper copy in the mail If you have any lab test that is abnormal or we need to change your treatment, we will call you to review the results.   Testing/Procedures: Your physician has requested that you have a lexiscan myoview. For further information please visit [email or web address] Please follow instruction sheet, as given.    Follow-Up: At [facility], you and your health needs are our priority.  As part of our continuing mission to provide you with exceptional heart care, we have created designated Provider Care Teams.  These Care Teams include your primary Cardiologist (physician) and Advanced Practice Providers (APPs -  Physician Assistants and Nurse Practitioners) who all work together to provide you with the care you need, when you need it.  We recommend signing up for the patient portal called "MyChart".  Sign up information is provided on this After Visit Summary.  MyChart is used to connect with patients for Virtual Visits (Telemedicine).  Patients are able to view lab/test results, encounter notes, upcoming appointments, etc.  Non-urgent messages can be sent to your provider as well.   To learn more about what you can do with MyChart, go to [email or web address].    Your next appointment:   1 year(s)  Provider:   You may see Chrystie Nose, MD or one of the following Advanced Practice Providers on your designated Care Team:   Randall An, PA-C  Jacolyn Reedy, PA-C     Other Instructions Thank you for choosing [facility]!

## 2023-05-26 ENCOUNTER — Encounter (HOSPITAL_COMMUNITY): Payer: BC Managed Care – PPO

## 2023-06-09 ENCOUNTER — Other Ambulatory Visit (HOSPITAL_COMMUNITY): Payer: Self-pay | Admitting: Adult Health Nurse Practitioner

## 2023-06-09 ENCOUNTER — Ambulatory Visit: Payer: BC Managed Care – PPO | Admitting: Student

## 2023-06-09 ENCOUNTER — Encounter (HOSPITAL_BASED_OUTPATIENT_CLINIC_OR_DEPARTMENT_OTHER)
Admission: RE | Admit: 2023-06-09 | Discharge: 2023-06-09 | Disposition: A | Discharge status: Home / Self Care | Payer: BC Managed Care – PPO | Source: Ambulatory Visit | Attending: Student | Admitting: Student

## 2023-06-09 ENCOUNTER — Ambulatory Visit (HOSPITAL_COMMUNITY)
Admission: RE | Admit: 2023-06-09 | Discharge: 2023-06-09 | Disposition: A | Discharge status: Home / Self Care | Payer: BC Managed Care – PPO | Source: Ambulatory Visit | Attending: Student | Admitting: Student

## 2023-06-09 ENCOUNTER — Encounter (HOSPITAL_COMMUNITY): Payer: Self-pay

## 2023-06-09 DIAGNOSIS — I2510 Atherosclerotic heart disease of native coronary artery without angina pectoris: Secondary | ICD-10-CM | POA: Diagnosis present

## 2023-06-09 DIAGNOSIS — R0609 Other forms of dyspnea: Secondary | ICD-10-CM | POA: Insufficient documentation

## 2023-06-09 DIAGNOSIS — Z1231 Encounter for screening mammogram for malignant neoplasm of breast: Secondary | ICD-10-CM

## 2023-06-09 MED ORDER — REGADENOSON 0.4 MG/5ML IV SOLN
INTRAVENOUS | Status: AC
  Administered 2023-06-09: 0.4 mg via INTRAVENOUS
  Filled 2023-06-09: qty 5

## 2023-06-09 MED ORDER — TECHNETIUM TC 99M TETROFOSMIN IV KIT
10.0000 | PACK | Freq: Once | INTRAVENOUS | Status: AC | PRN
  Administered 2023-06-09: 11 via INTRAVENOUS

## 2023-06-09 MED ORDER — TECHNETIUM TC 99M TETROFOSMIN IV KIT
30.0000 | PACK | Freq: Once | INTRAVENOUS | Status: AC | PRN
  Administered 2023-06-09: 29.2 via INTRAVENOUS

## 2023-06-09 MED ORDER — SODIUM CHLORIDE FLUSH 0.9 % IV SOLN
INTRAVENOUS | Status: AC
  Administered 2023-06-09: 10 mL via INTRAVENOUS
  Filled 2023-06-09: qty 10

## 2023-06-21 ENCOUNTER — Encounter: Payer: Self-pay | Admitting: Obstetrics & Gynecology

## 2023-06-21 ENCOUNTER — Ambulatory Visit (INDEPENDENT_AMBULATORY_CARE_PROVIDER_SITE_OTHER): Payer: BC Managed Care – PPO | Admitting: Obstetrics & Gynecology

## 2023-06-21 VITALS — BP 157/92 | HR 94 | Ht 60.0 in | Wt 146.0 lb

## 2023-06-21 DIAGNOSIS — N814 Uterovaginal prolapse, unspecified: Secondary | ICD-10-CM

## 2023-06-21 DIAGNOSIS — B9689 Other specified bacterial agents as the cause of diseases classified elsewhere: Secondary | ICD-10-CM

## 2023-06-21 DIAGNOSIS — N760 Acute vaginitis: Secondary | ICD-10-CM | POA: Diagnosis not present

## 2023-06-21 DIAGNOSIS — Z4689 Encounter for fitting and adjustment of other specified devices: Secondary | ICD-10-CM | POA: Diagnosis not present

## 2023-06-21 NOTE — Progress Notes (Signed)
Chief Complaint  Patient presents with   Pessary Check    Blood pressure (!) 157/92, pulse 94, height 5' (1.524 m), weight 146 lb (66.2 kg).  Cheryl Dunn presents today for routine follow up related to her pessary.   She uses a Milex ring with support #5 She reports no vaginal discharge and no vaginal bleeding   Likert scale(1 not bothersome -5 very bothersome)  :  1  Exam reveals no undue vaginal mucosal pressure of breakdown, no discharge and no vaginal bleeding.  Vaginal Epithelial Abnormality Classification System:   03 0    No abnormalities 1    Epithelial erythema 2    Granulation tissue 3    Epithelial break or erosion, 1 cm or less 4    Epithelial break or erosion, 1 cm or greater  The pessary is removed, cleaned and replaced without difficulty.      ICD-10-CM   1. Pessary maintenance, Milex ring with support #5, OF 11/2018  Z46.89     2. Cystocele with uterine prolapse  N81.4     3. BV (bacterial vaginosis): chronic with the pessary, using boric acid suppositories 1-2 times per week  N76.0    B96.89        Cheryl Dunn will be sen back in 3 months for continued follow up.  Lazaro Arms, MD  06/21/2023 9:27 AM

## 2023-07-26 ENCOUNTER — Ambulatory Visit (HOSPITAL_COMMUNITY): Payer: BC Managed Care – PPO

## 2023-07-28 ENCOUNTER — Ambulatory Visit (HOSPITAL_COMMUNITY)
Admission: RE | Admit: 2023-07-28 | Discharge: 2023-07-28 | Disposition: A | Discharge status: Home / Self Care | Payer: BC Managed Care – PPO | Source: Ambulatory Visit | Attending: Adult Health Nurse Practitioner | Admitting: Adult Health Nurse Practitioner

## 2023-07-28 DIAGNOSIS — Z1231 Encounter for screening mammogram for malignant neoplasm of breast: Secondary | ICD-10-CM | POA: Insufficient documentation

## 2023-11-01 ENCOUNTER — Ambulatory Visit
Admission: EM | Admit: 2023-11-01 | Discharge: 2023-11-01 | Disposition: A | Discharge status: Home / Self Care | Payer: BC Managed Care – PPO | Attending: Nurse Practitioner | Admitting: Nurse Practitioner

## 2023-11-01 DIAGNOSIS — J0140 Acute pansinusitis, unspecified: Secondary | ICD-10-CM

## 2023-11-01 LAB — POC COVID19/FLU A&B COMBO
Covid Antigen, POC: NEGATIVE
Influenza A Antigen, POC: NEGATIVE
Influenza B Antigen, POC: NEGATIVE

## 2023-11-01 MED ORDER — DOXYCYCLINE HYCLATE 100 MG PO TABS: 100.0000 mg | ORAL_TABLET | Freq: Two times a day (BID) | ORAL | 0 refills | Status: AC

## 2023-11-01 NOTE — ED Provider Notes (Signed)
[facility]    CSN: [id number] Arrival date & time: 11/01/23  1520      History   Chief Complaint Chief Complaint  Patient presents with   Facial Pain   Cough    HPI Cheryl Dunn is a 67 y.o. female.   The history is provided by the patient.   Patient presents for complaints of headache, nasal congestion, runny nose, bilateral eye pressure, bilateral ear pressure, and cough.  Patient reports that she is felt like she has been febrile.  Denies ear drainage, wheezing, difficulty breathing, chest pain, abdominal pain, nausea, vomiting, diarrhea, or rash.  Patient reports that she is taken numerous over-the-counter medications with minimal relief of her symptoms to include Robitussin, Tylenol, NyQuil, and using her Flonase and Afrin for severe congestion.  Patient reports that she did take a home COVID test which was negative. Past Medical History:  Diagnosis Date   Arthritis    CAD (coronary artery disease)    a. s/p CABG in 2002 with LIMA-LAD and RA to LCx b. 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/08/2013   Dyslipidemia 09/08/2013   HTN (hypertension) 09/08/2013   DM2 (diabetes mellitus, type 2) (HCC) 09/08/2013   Hypothyroidism 09/08/2013    Past Surgical History:  Procedure Laterality Date   APPENDECTOMY     CHOLECYSTECTOMY  1982   CORONARY ANGIOPLASTY WITH STENT PLACEMENT  08/2000   stent   CORONARY ANGIOPLASTY WITH STENT PLACEMENT  06/2009   stent   CORONARY ARTERY BYPASS GRAFT  12/14/2000   FEMORAL ARTERY REPAIR  2010   post-cath   ORIF ANKLE FRACTURE Right    TUBAL LIGATION  1984   UMBILICAL HERNIA REPAIR N/A  10/22/2017   Procedure: OPEN EPIGASTRIC HERNIA REPAIR WITH MESH;  Surgeon: Lucretia Roers, MD;  Location: [facility];  Service: General;  Laterality: N/A;    OB History     Gravida  2   Para  2   Term  2   Preterm      AB      Living  1      SAB      IAB      Ectopic      Multiple      Live Births  2            Home Medications    Prior to Admission medications   Medication Sig Start Date End Date Taking? Authorizing Provider  celecoxib (CELEBREX) 200 MG capsule Take 200 mg by mouth daily.   Yes [provider]  clopidogrel (PLAVIX) 75 MG tablet Take 75 mg by mouth daily with breakfast.   Yes [provider]  doxycycline (VIBRA-TABS) 100 MG tablet Take 1 tablet (100 mg total) by mouth 2 (two) times daily for 7 days. 11/01/23 11/08/23 Yes Leath-Warren, Sadie Haber, NP  fluticasone (FLONASE) 50 MCG/ACT nasal spray Place into both nostrils. 09/11/20  Yes [provider]  latanoprost (XALATAN) 0.005 % ophthalmic solution SMARTSIG:1 Drop(s) In Eye(s) Every Evening 01/27/23  Yes [provider]  levothyroxine (SYNTHROID) 150 MCG tablet  Take 150 mcg by mouth daily before breakfast. 04/28/23  Yes [provider]  metFORMIN (GLUCOPHAGE-XR) 500 MG 24 hr tablet Take 500 mg by mouth 2 (two) times daily. 11/28/20  Yes [provider]  metoprolol tartrate (LOPRESSOR) 25 MG tablet Take 25 mg by mouth 2 (two) times daily.   Yes [provider]  omeprazole (PRILOSEC) 40 MG capsule Take 40 mg by mouth daily. 09/05/21  Yes [provider]  ONETOUCH ULTRA test strip 1 each daily. 09/11/20  Yes [provider]  OZEMPIC, 0.25 OR 0.5 MG/DOSE, 2 MG/1.5ML SOPN Inject into the skin. 04/26/21  Yes [provider]  rosuvastatin (CRESTOR) 10 MG tablet Take 10 mg by mouth daily.   Yes [provider]  albuterol (VENTOLIN HFA) 108 (90 Base) MCG/ACT inhaler Inhale 2 puffs into the lungs every 4 (four) hours  as needed for wheezing or shortness of breath. Patient not taking: Reported on 05/12/2023 08/26/22   [name], PA-C  metroNIDAZOLE (METROGEL VAGINAL) 0.75 % vaginal gel Nightly x 5 nights 10/09/21   Lazaro Arms, MD  nitroGLYCERIN (NITROSTAT) 0.4 MG SL tablet Place 1 tablet (0.4 mg total) under the tongue every 5 (five) minutes as needed for chest pain. 07/23/22   Hilty, Lisette Abu, MD  promethazine-dextromethorphan (PROMETHAZINE-DM) 6.25-15 MG/5ML syrup Take 5 mLs by mouth 4 (four) times daily as needed. 08/26/22   [name], PA-C    Family History Family History  Problem Relation Age of Onset   Hypertension Mother    Diabetes Mother    Hypertension Father    Diabetes Father    Heart attack Maternal Grandfather    Hypertension Paternal Grandmother    Liver disease Paternal Grandfather    Hyperlipidemia Sister    Heart attack Sister        bypass surgery   Other Daughter        meningitis    Social History Social History   Tobacco Use   Smoking status: Former    Current packs/day: 0.00    Average packs/day: 0.3 packs/day for 20.0 years (5.0 ttl pk-yrs)    Types: Cigarettes    Start date: 10/18/1984    Quit date: 10/18/2004    Years since quitting: 19.0   Smokeless tobacco: Never  Vaping Use   Vaping status: Never Used  Substance Use Topics   Alcohol use: No    Alcohol/week: 0.0 standard drinks of alcohol   Drug use: No     Allergies   Amoxicillin, Codeine, and Vicodin [hydrocodone-acetaminophen]   Review of Systems Review of Systems Per HPI  Physical Exam Triage Vital Signs ED Triage Vitals [11/01/23 1649]  Encounter Vitals Group     BP (!) 158/98     Systolic BP Percentile      Diastolic BP Percentile      Pulse Rate (!) 118     Resp 18     Temp 98 F (36.7 C)     Temp Source Oral     SpO2 96 %     Weight      Height      Head Circumference      Peak Flow      Pain Score 7     Pain Loc      Pain Education      Exclude  from Growth Chart    No data found.  Updated Vital Signs BP (!) 158/98 (BP Location: Right Arm)   Pulse (!) 118  Temp 98 F (36.7 C) (Oral)   Resp 18   SpO2 96%   Visual Acuity Right Eye Distance:   Left Eye Distance:   Bilateral Distance:    Right Eye Near:   Left Eye Near:    Bilateral Near:     Physical Exam Vitals and nursing note reviewed.  Constitutional:      General: She is not in acute distress.    Appearance: Normal appearance.  HENT:     Head: Normocephalic.     Right Ear: Tympanic membrane, ear canal and external ear normal.     Left Ear: Tympanic membrane, ear canal and external ear normal.     Nose: Congestion present.     Right Turbinates: Enlarged and swollen.     Left Turbinates: Enlarged and swollen.     Right Sinus: Maxillary sinus tenderness and frontal sinus tenderness present.     Left Sinus: Maxillary sinus tenderness and frontal sinus tenderness present.     Mouth/Throat:     Lips: Pink.     Mouth: Mucous membranes are moist.     Pharynx: Oropharynx is clear. Uvula midline. Posterior oropharyngeal erythema and postnasal drip present. No pharyngeal swelling, oropharyngeal exudate or uvula swelling.     Comments: Cobblestoning present to posterior oropharynx  Eyes:     Extraocular Movements: Extraocular movements intact.     Conjunctiva/sclera: Conjunctivae normal.     Pupils: Pupils are equal, round, and reactive to light.  Cardiovascular:     Rate and Rhythm: Regular rhythm. Tachycardia present.     Pulses: Normal pulses.     Heart sounds: Normal heart sounds.  Pulmonary:     Effort: Pulmonary effort is normal. No respiratory distress.     Breath sounds: Normal breath sounds. No stridor. No wheezing, rhonchi or rales.  Abdominal:     General: Bowel sounds are normal.     Palpations: Abdomen is soft.     Tenderness: There is no abdominal tenderness.  Musculoskeletal:     Cervical back: Normal range of motion.  Lymphadenopathy:      Cervical: No cervical adenopathy.  Skin:    General: Skin is warm and dry.  Neurological:     General: No focal deficit present.     Mental Status: She is alert and oriented to person, place, and time.  Psychiatric:        Mood and Affect: Mood normal.        Behavior: Behavior normal.      UC Treatments / Results  Labs (all labs ordered are listed, but only abnormal results are displayed) Labs Reviewed  POC COVID19/FLU A&B COMBO - Normal    EKG   Radiology No results found.  Procedures Procedures (including critical care time)  Medications Ordered in UC Medications - No data to display  Initial Impression / Assessment and Plan / UC Course  I have reviewed the triage vital signs and the nursing notes.  Pertinent labs & imaging results that were available during my care of the patient were reviewed by me and considered in my medical decision making (see chart for details).  On exam, patient with moderate maxillary and frontal sinus tenderness.  She has also expressed a subjective fever.  Given the current symptoms, and complaints of sinus tenderness and pain, will start patient on doxycycline 100 mg twice daily for the next 7 days.  Supportive care recommendations were provided and discussed with the patient to include fluids, rest, over-the-counter analgesics, continuing Flonase,  and use of normal saline nasal spray for nasal congestion.  Patient advised to refrain from continuous use of Afrin as this will worsen nasal congestion.  Patient was in agreement with this plan of care and verbalizes understanding.  All questions were answered.  Patient stable for discharge.   Final Clinical Impressions(s) / UC Diagnoses   Final diagnoses:  Acute pansinusitis, recurrence not specified     Discharge Instructions      Take medication as directed. Continue Flonase and Robitussin.  Increase fluids and get plenty of rest. May take over-the-counter Tylenol as needed for pain,  fever, or general discomfort. Recommend normal saline nasal spray to help with nasal congestion throughout the day. For your cough, it may be helpful to use a humidifier at bedtime during sleep. If symptoms do not improve with this treatment, you may follow-up in this clinic or with your PCP for further evaluation.  Follow-up as needed.      ED Prescriptions     Medication Sig Dispense Auth. Provider   doxycycline (VIBRA-TABS) 100 MG tablet Take 1 tablet (100 mg total) by mouth 2 (two) times daily for 7 days. 14 tablet Leath-Warren, Sadie Haber, NP      PDMP not reviewed this encounter.   Abran Cantor, NP 11/01/23 [phone]

## 2023-11-01 NOTE — ED Triage Notes (Signed)
Sinus pain and pressure, cough, sore throat, headache, body aches, chills x 3 days. Taking Claritin, Nyquil, tylenol and robitussin. With no relief of symptoms.   Pt states she took a home covid test Sunday at it was negative.

## 2023-11-01 NOTE — Discharge Instructions (Addendum)
Take medication as directed. Continue Flonase and Robitussin.  Increase fluids and get plenty of rest. May take over-the-counter Tylenol as needed for pain, fever, or general discomfort. Recommend normal saline nasal spray to help with nasal congestion throughout the day. For your cough, it may be helpful to use a humidifier at bedtime during sleep. If symptoms do not improve with this treatment, you may follow-up in this clinic or with your PCP for further evaluation.  Follow-up as needed.

## 2023-11-23 ENCOUNTER — Other Ambulatory Visit (HOSPITAL_COMMUNITY)
Admission: RE | Admit: 2023-11-23 | Discharge: 2023-11-23 | Disposition: A | Discharge status: Home / Self Care | Payer: BC Managed Care – PPO | Source: Ambulatory Visit | Attending: Obstetrics & Gynecology | Admitting: Obstetrics & Gynecology

## 2023-11-23 ENCOUNTER — Ambulatory Visit: Payer: BC Managed Care – PPO | Admitting: Obstetrics & Gynecology

## 2023-11-23 VITALS — BP 138/82 | HR 92 | Ht 60.0 in | Wt 145.0 lb

## 2023-11-23 DIAGNOSIS — Z01419 Encounter for gynecological examination (general) (routine) without abnormal findings: Secondary | ICD-10-CM

## 2023-11-23 DIAGNOSIS — N760 Acute vaginitis: Secondary | ICD-10-CM | POA: Diagnosis not present

## 2023-11-23 DIAGNOSIS — B9689 Other specified bacterial agents as the cause of diseases classified elsewhere: Secondary | ICD-10-CM

## 2023-11-23 DIAGNOSIS — Z4689 Encounter for fitting and adjustment of other specified devices: Secondary | ICD-10-CM | POA: Diagnosis not present

## 2023-11-23 DIAGNOSIS — Z1151 Encounter for screening for human papillomavirus (HPV): Secondary | ICD-10-CM

## 2023-11-23 MED ORDER — METRONIDAZOLE 0.75 % VA GEL: VAGINAL | 5 refills | Status: DC

## 2023-11-23 NOTE — Progress Notes (Signed)
Subjective:     Cheryl Dunn is a 68 y.o. female here for a routine exam.  No LMP recorded. Patient has had an ablation. [id number] Birth Control Method:  menopausal Menstrual Calendar(currently): na  Current complaints: greenish vaginal discharge.   Current acute medical issues:  none   Recent Gynecologic History No LMP recorded. Patient has had an ablation. Last Pap: 10/2021,  normal Last mammogram: 07/28/2023,  normal  Past Medical History:  Diagnosis Date   Arthritis    CAD (coronary artery disease)    a. s/p CABG in 2002 with LIMA-LAD and RA to LCx b. patent grafts by cath in 2010   Diabetes (HCC) 08/2013   GERD (gastroesophageal reflux disease)    Hyperlipidemia    Hypertension    Hypothyroidism    Sleep apnea 04/2021    Past Surgical History:  Procedure Laterality Date   APPENDECTOMY     CHOLECYSTECTOMY  1982   CORONARY ANGIOPLASTY WITH STENT PLACEMENT  08/2000   stent   CORONARY ANGIOPLASTY WITH STENT PLACEMENT  06/2009   stent   CORONARY ARTERY BYPASS GRAFT  12/14/2000   FEMORAL ARTERY REPAIR  2010   post-cath   ORIF ANKLE FRACTURE Right    TUBAL LIGATION  1984   UMBILICAL HERNIA REPAIR N/A 10/22/2017   Procedure: OPEN EPIGASTRIC HERNIA REPAIR WITH MESH;  Surgeon: Lucretia Roers, MD;  Location: [facility];  Service: General;  Laterality: N/A;    OB History     Gravida  2   Para  2   Term  2   Preterm      AB      Living  1      SAB      IAB      Ectopic      Multiple      Live Births  2           Social History   Socioeconomic History   Marital status: Married    Spouse name: Not on file   Number of children: 1   Years of education: Not on file   Highest education level: Not on file  Occupational History   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[employer], DDS  Tobacco Use   Smoking status: Former    Current packs/day: 0.00    Average packs/day: 0.3 packs/day for 20.0 years (5.0 ttl pk-yrs)    Types: Cigarettes     Start date: 10/18/1984    Quit date: 10/18/2004    Years since quitting: 19.1   Smokeless tobacco: Never  Vaping Use   Vaping status: Never Used  Substance and Sexual Activity   Alcohol use: No    Alcohol/week: 0.0 standard drinks of alcohol   Drug use: No   Sexual activity: Yes    Birth control/protection: Post-menopausal, Surgical    Comment: tubal  Other Topics Concern   Not on file  Social History Narrative   Not on file   Social Drivers of Health   Financial Resource Strain: Low Risk  (11/23/2023)   Overall Financial Resource Strain (CARDIA)    Difficulty of Paying Living Expenses: Not hard at all  Food Insecurity: No Food Insecurity (11/23/2023)   Hunger Vital Sign    Worried About Running Out of Food in the Last Year: Never true    Ran Out of Food in the Last Year: Never true  Transportation Needs: No Transportation Needs (11/23/2023)   PRAPARE - Transportation    Lack of  Transportation (Medical): No    Lack of Transportation (Non-Medical): No  Physical Activity: Insufficiently Active (11/23/2023)   Exercise Vital Sign    Days of Exercise per Week: 2 days    Minutes of Exercise per Session: 30 min  Stress: Stress Concern Present (11/23/2023)   [employer] of Occupational Health - Occupational Stress Questionnaire    Feeling of Stress : To some extent  Social Connections: Socially Integrated (11/23/2023)   Social Connection and Isolation Panel [NHANES]    Frequency of Communication with Friends and Family: More than three times a week    Frequency of Social Gatherings with Friends and Family: More than three times a week    Attends Religious Services: More than 4 times per year    Active Member of [employer] or Organizations: Yes    Attends Engineer, structural: More than 4 times per year    Marital Status: Married    Family History  Problem Relation Age of Onset   Hypertension Mother    Diabetes Mother    Hypertension Father    Diabetes Father    Heart  attack Maternal Grandfather    Hypertension Paternal Grandmother    Liver disease Paternal Grandfather    Hyperlipidemia Sister    Heart attack Sister        bypass surgery   Other Daughter        meningitis     Current Outpatient Medications:    albuterol (VENTOLIN HFA) 108 (90 Base) MCG/ACT inhaler, Inhale 2 puffs into the lungs every 4 (four) hours as needed for wheezing or shortness of breath., Disp: 18 g, Rfl: 0   celecoxib (CELEBREX) 200 MG capsule, Take 200 mg by mouth daily., Disp: , Rfl:    clopidogrel (PLAVIX) 75 MG tablet, Take 75 mg by mouth daily with breakfast., Disp: , Rfl:    fluticasone (FLONASE) 50 MCG/ACT nasal spray, Place into both nostrils., Disp: , Rfl:    latanoprost (XALATAN) 0.005 % ophthalmic solution, SMARTSIG:1 Drop(s) In Eye(s) Every Evening, Disp: , Rfl:    levothyroxine (SYNTHROID) 150 MCG tablet, Take 150 mcg by mouth daily before breakfast., Disp: , Rfl:    metFORMIN (GLUCOPHAGE-XR) 500 MG 24 hr tablet, Take 500 mg by mouth 2 (two) times daily., Disp: , Rfl:    metoprolol tartrate (LOPRESSOR) 25 MG tablet, Take 25 mg by mouth 2 (two) times daily., Disp: , Rfl:    metroNIDAZOLE (METROGEL VAGINAL) 0.75 % vaginal gel, Nightly x 5 nights, Disp: 70 g, Rfl: 5   metroNIDAZOLE (METROGEL) 0.75 % vaginal gel, Use every other night at bedtime for 5 applications, Disp: 70 g, Rfl: 5   nitroGLYCERIN (NITROSTAT) 0.4 MG SL tablet, Place 1 tablet (0.4 mg total) under the tongue every 5 (five) minutes as needed for chest pain., Disp: 25 tablet, Rfl: 3   omeprazole (PRILOSEC) 40 MG capsule, Take 40 mg by mouth daily., Disp: , Rfl:    ONETOUCH ULTRA test strip, 1 each daily., Disp: , Rfl:    OZEMPIC, 0.25 OR 0.5 MG/DOSE, 2 MG/1.5ML SOPN, Inject into the skin., Disp: , Rfl:    promethazine-dextromethorphan (PROMETHAZINE-DM) 6.25-15 MG/5ML syrup, Take 5 mLs by mouth 4 (four) times daily as needed., Disp: 100 mL, Rfl: 0   rosuvastatin (CRESTOR) 10 MG tablet, Take 10 mg by mouth  daily., Disp: , Rfl:   Review of Systems  Review of Systems  Constitutional: Negative for fever, chills, weight loss, malaise/fatigue and diaphoresis.  HENT: Negative for hearing loss, ear  pain, nosebleeds, congestion, sore throat, neck pain, tinnitus and ear discharge.   Eyes: Negative for blurred vision, double vision, photophobia, pain, discharge and redness.  Respiratory: Negative for cough, hemoptysis, sputum production, shortness of breath, wheezing and stridor.   Cardiovascular: Negative for chest pain, palpitations, orthopnea, claudication, leg swelling and PND.  Gastrointestinal: negative for abdominal pain. Negative for heartburn, nausea, vomiting, diarrhea, constipation, blood in stool and melena.  Genitourinary: Negative for dysuria, urgency, frequency, hematuria and flank pain.  Musculoskeletal: Negative for myalgias, back pain, joint pain and falls.  Skin: Negative for itching and rash.  Neurological: Negative for dizziness, tingling, tremors, sensory change, speech change, focal weakness, seizures, loss of consciousness, weakness and headaches.  Endo/Heme/Allergies: Negative for environmental allergies and polydipsia. Does not bruise/bleed easily.  Psychiatric/Behavioral: Negative for depression, suicidal ideas, hallucinations, memory loss and substance abuse. The patient is not nervous/anxious and does not have insomnia.        Objective:  Blood pressure 138/82, pulse 92, height 5' (1.524 m), weight 145 lb (65.8 kg).   Physical Exam  Vitals reviewed. Constitutional: She is oriented to person, place, and time. She appears well-developed and well-nourished.  HENT:  Head: Normocephalic and atraumatic.        Right Ear: External ear normal.  Left Ear: External ear normal.  Nose: Nose normal.  Mouth/Throat: Oropharynx is clear and moist.  Eyes: Conjunctivae and EOM are normal. Pupils are equal, round, and reactive to light. Right eye exhibits no discharge. Left eye exhibits  no discharge. No scleral icterus.  Neck: Normal range of motion. Neck supple. No tracheal deviation present. No thyromegaly present.  Cardiovascular: Normal rate, regular rhythm, normal heart sounds and intact distal pulses.  Exam reveals no gallop and no friction rub.   No murmur heard. Respiratory: Effort normal and breath sounds normal. No respiratory distress. She has no wheezes. She has no rales. She exhibits no tenderness.  GI: Soft. Bowel sounds are normal. She exhibits no distension and no mass. There is no tenderness. There is no rebound and no guarding.  Genitourinary:  Breasts no masses skin changes or nipple changes bilaterally      Vulva is normal without lesions Vagina is pink moist without discharge Cervix normal in appearance and pap is done Uterus is normal size shape and contour Adnexa is negative with normal sized ovaries   Musculoskeletal: Normal range of motion. She exhibits no edema and no tenderness.  Neurological: She is alert and oriented to person, place, and time. She has normal reflexes. She displays normal reflexes. No cranial nerve deficit. She exhibits normal muscle tone. Coordination normal.  Skin: Skin is warm and dry. No rash noted. No erythema. No pallor.  Psychiatric: She has a normal mood and affect. Her behavior is normal. Judgment and thought content normal.      Chief Complaint  Patient presents with   Gynecologic Exam    And Pessary    Blood pressure 138/82, pulse 92, height 5' (1.524 m), weight 145 lb (65.8 kg).  Cheryl Dunn presents today for routine follow up related to her pessary.   She uses a Milex rin with support #5 She reports moderate vaginal discharge and no vaginal bleeding   Likert scale(1 not bothersome -5 very bothersome)  :  2  Exam reveals no undue vaginal mucosal pressure of breakdown, moderate discharge and little vaginal bleeding.  Vaginal Epithelial Abnormality Classification System:   2 0    No abnormalities 1     Epithelial erythema  2    Granulation tissue 3    Epithelial break or erosion, 1 cm or less 4    Epithelial break or erosion, 1 cm or greater  The pessary is removed, cleaned and replaced without difficulty.      ICD-10-CM   1. Well woman exam with routine gynecological exam  Z01.419     2. BV (bacterial vaginosis): chronic with the pessary, using boric acid suppositories 1-2 times per week  N76.0    B96.89    5 app course of metro gel first    3. Encounter for gynecological examination with Papanicolaou smear of cervix  Z01.419 Cytology - PAP( [place])    4. Pessary maintenance, Milex ring with support #5, OF 11/2018  Z46.89        Cheryl Dunn will be sen back in 3 months for continued follow up.  Lazaro Arms, MD  11/23/2023 9:03 AM     Medications Ordered at today's visit: Meds ordered this encounter  Medications   metroNIDAZOLE (METROGEL) 0.75 % vaginal gel    Sig: Use every other night at bedtime for 5 applications    Dispense:  70 g    Refill:  5    Other orders placed at today's visit: No orders of the defined types were placed in this encounter.    ASSESSMENT + PLAN:    ICD-10-CM   1. Well woman exam with routine gynecological exam  Z01.419     2. BV (bacterial vaginosis): chronic with the pessary, using boric acid suppositories 1-2 times per week  N76.0    B96.89    5 app course of metro gel first    3. Encounter for gynecological examination with Papanicolaou smear of cervix  Z01.419 Cytology - PAP( [place])    4. Pessary maintenance, Milex ring with support #5, OF 11/2018  Z46.89           Return in about 3 months (around 02/21/2024) for Follow up, pessary, with Dr Despina Hidden.

## 2023-11-26 LAB — CYTOLOGY - PAP
Comment: NEGATIVE
Diagnosis: NEGATIVE
Diagnosis: REACTIVE
High risk HPV: NEGATIVE

## 2024-03-02 ENCOUNTER — Ambulatory Visit: Admitting: Obstetrics & Gynecology

## 2024-03-02 ENCOUNTER — Encounter: Payer: Self-pay | Admitting: Obstetrics & Gynecology

## 2024-03-02 VITALS — BP 124/80 | HR 102 | Ht 60.0 in | Wt 142.0 lb

## 2024-03-02 DIAGNOSIS — N814 Uterovaginal prolapse, unspecified: Secondary | ICD-10-CM

## 2024-03-02 DIAGNOSIS — Z4689 Encounter for fitting and adjustment of other specified devices: Secondary | ICD-10-CM

## 2024-03-02 NOTE — Progress Notes (Signed)
 Chief Complaint  Patient presents with   Pessary Check    Blood pressure 124/80, pulse (!) 102, height 5' (1.524 m), weight 142 lb (64.4 kg).  Cheryl Dunn presents today for routine follow up related to her pessary.   She uses a Milex ring with support #5 She reports no vaginal discharge and no vaginal bleeding   Likert scale(1 not bothersome -5 very bothersome)  :  1  Exam reveals no undue vaginal mucosal pressure of breakdown, no discharge and no vaginal bleeding.  Vaginal Epithelial Abnormality Classification System:   0 0    No abnormalities 1    Epithelial erythema 2    Granulation tissue 3    Epithelial break or erosion, 1 cm or less 4    Epithelial break or erosion, 1 cm or greater  The pessary is removed, cleaned and replaced without difficulty.      ICD-10-CM   1. Pessary maintenance, Milex ring with support #5, OF 11/2018  Z46.89     2. Cystocele with uterine prolapse  N81.4        Alya Friley will be sen back in 4 months for continued follow up. Continue intermittent boric acid suppositories on a regular basis or prn  Wendelyn Halter, MD  03/02/2024 9:12 AM

## 2024-06-15 ENCOUNTER — Ambulatory Visit: Admitting: Obstetrics & Gynecology

## 2024-06-15 VITALS — BP 145/86 | HR 80 | Ht 60.0 in | Wt 142.0 lb

## 2024-06-15 DIAGNOSIS — Z4689 Encounter for fitting and adjustment of other specified devices: Secondary | ICD-10-CM | POA: Diagnosis not present

## 2024-06-15 DIAGNOSIS — N3281 Overactive bladder: Secondary | ICD-10-CM | POA: Diagnosis not present

## 2024-06-15 MED ORDER — MIRABEGRON ER 50 MG PO TB24: 50.0000 mg | ORAL_TABLET | Freq: Every day | ORAL | 11 refills | Status: DC

## 2024-06-15 NOTE — Progress Notes (Signed)
 Chief Complaint  Patient presents with   Pessary Check    Blood pressure (!) 145/86, pulse 80, height 5' (1.524 m), weight 142 lb (64.4 kg).  Cheryl Dunn presents today for routine follow up related to her pessary.   She uses a Milex ring with support #5 She reports no vaginal discharge and no vaginal bleeding   Likert scale(1 not bothersome -5 very bothersome)  :  1  Exam reveals no undue vaginal mucosal pressure of breakdown, no discharge and no vaginal bleeding.  Vaginal Epithelial Abnormality Classification System:   0 0    No abnormalities 1    Epithelial erythema 2    Granulation tissue 3    Epithelial break or erosion, 1 cm or less 4    Epithelial break or erosion, 1 cm or greater  The pessary is removed, cleaned and replaced without difficulty.      ICD-10-CM   1. Pessary maintenance, Milex ring with support #5, OF 11/2018  Z46.89     2. OAB (overactive bladder)/detrussor instability: trial myrbetriq  50 mg qhs  N32.81        [name] will be sen back in 4 months for continued follow up.  Vonn VEAR Inch, MD  06/15/2024 9:42 AM

## 2024-07-27 ENCOUNTER — Other Ambulatory Visit (HOSPITAL_COMMUNITY): Payer: Self-pay | Admitting: Adult Health Nurse Practitioner

## 2024-07-27 DIAGNOSIS — Z1231 Encounter for screening mammogram for malignant neoplasm of breast: Secondary | ICD-10-CM

## 2024-08-07 ENCOUNTER — Ambulatory Visit (HOSPITAL_COMMUNITY)
Admission: RE | Admit: 2024-08-07 | Discharge: 2024-08-07 | Disposition: A | Discharge status: Home / Self Care | Source: Ambulatory Visit | Attending: Adult Health Nurse Practitioner | Admitting: Adult Health Nurse Practitioner

## 2024-08-07 DIAGNOSIS — Z1231 Encounter for screening mammogram for malignant neoplasm of breast: Secondary | ICD-10-CM | POA: Diagnosis present

## 2024-08-22 NOTE — Progress Notes (Unsigned)
 Cardiology Office Note    Date:  08/23/2024  ID:  Herman, Mell 1956-08-18, MRN [id number] Cardiologist: Vinie JAYSON Maxcy, MD Cardiology APP:  Johnson Laymon HERO, PA-C { :  History of Present Illness:    Cheryl Dunn is a 68 y.o. female with past medical history of CAD (s/p CABG in 2002 with LIMA-LAD and RA to LCx, patent grafts by cath in 2010), HTN, HLD, Type 2 DM and hypothyroidism who presents to the office today for annual follow-up.  She was last examined by myself in 05/2023 and reported having worsening dyspnea on exertion over the past 6 months but had been less active given the heat. She denied any recent chest pain. Given the timeframe since her last ischemic evaluation and worsening symptoms, a Lexiscan  Myoview  was recommended for further assessment. She was continued on her current cardiac medications with Plavix 75 mg daily (previously preferred to remain on this as compared to ASA), Lopressor 25 mg twice daily and Crestor 10 mg daily. NST was performed in 06/2023 and showed no evidence of ischemia or infarction and was a low-risk study.  In talking with the patient today, she reports overall doing well from a cardiac perspective since her last office visit. 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 She denies any recent exertional chest pain or progressive dyspnea on exertion. Does report fleeting episodes of pain which only last for a few seconds and are mostly right-sided but no persistent symptoms. She has not had to utilize sublingual nitroglycerin . She denies any palpitations, orthopnea, PND or pitting edema. Prefers to take Plavix as compared to ASA as ASA previously aggravated her stomach.   Studies Reviewed:   EKG: EKG is ordered today and demonstrates:   EKG Interpretation Date/Time:  Wednesday August 23 2024 08:05:06 EDT Ventricular Rate:  90 PR Interval:  108 QRS Duration:  84 QT Interval:  380 QTC  Calculation: 464 R Axis:   46  Text Interpretation: Sinus rhythm with short PR When compared with ECG of 12-May-2023 15:39, No significant change was found Confirmed by Johnson Laymon ([id number]) on 08/23/2024 8:11:58 AM       NST: 06/2023   Stress ECG is negative for ischemia and arrhythmias.   LV perfusion is normal. There is no evidence of ischemia or infarction.   Left ventricular function is normal. Nuclear stress EF: 66%.   Findings are consistent with no ischemia or infarction. The study is low risk.   Physical Exam:   VS:  BP 130/78 (BP Location: Right Arm, Cuff Size: Normal)   Pulse 86   Ht 5' (1.524 m)   Wt 140 lb 12.8 oz (63.9 kg)   SpO2 100%   BMI 27.50 kg/m    Wt Readings from Last 3 Encounters:  08/23/24 140 lb 12.8 oz (63.9 kg)  06/15/24 142 lb (64.4 kg)  03/02/24 142 lb (64.4 kg)     GEN: Well nourished, well developed female appearing in no acute distress NECK: No JVD; No carotid bruits CARDIAC: RRR, no murmurs, rubs, gallops RESPIRATORY:  Clear to auscultation without rales, wheezing or rhonchi  ABDOMEN: Appears non-distended. No obvious abdominal masses. EXTREMITIES: No clubbing or cyanosis. No pitting edema.  Distal pedal pulses are 2+ bilaterally.   Assessment and Plan:   1. Coronary artery disease involving native coronary artery of native heart without angina pectoris - She previously underwent CABG in 2002 with details as outlined above and had patent  grafts by catheterization in 2010. NST in 06/2023 showed no evidence of ischemia and was a low-risk study. - She remains active at baseline and denies any recent anginal symptoms. Her episodes of chest pain only last for a few seconds and overall seem atypical for angina and spontaneously resolve. We reviewed warning signs to monitor for. Will provide an updated Rx for SL NTG. Continue Plavix 75 mg daily, Lopressor 25 mg twice daily and Crestor 10 mg daily  2. Essential hypertension - BP is  well-controlled at 130/78 during today's visit. Continue current medical therapy with Lopressor 25 mg twice daily.  3. Hyperlipidemia LDL goal <70 - Followed by PCP. LDL was 82 when checked in 04/2024  She is currently on Crestor 10 mg daily and we reviewed that if her LDL remains above goal, would recommend titration of this to 20 mg daily.   Signed, Laymon CHRISTELLA Qua, PA-C

## 2024-08-23 ENCOUNTER — Encounter: Payer: Self-pay | Admitting: Student

## 2024-08-23 ENCOUNTER — Ambulatory Visit: Attending: Student | Admitting: Student

## 2024-08-23 VITALS — BP 130/78 | HR 86 | Ht 60.0 in | Wt 140.8 lb

## 2024-08-23 DIAGNOSIS — E785 Hyperlipidemia, unspecified: Secondary | ICD-10-CM

## 2024-08-23 DIAGNOSIS — I10 Essential (primary) hypertension: Secondary | ICD-10-CM | POA: Diagnosis not present

## 2024-08-23 DIAGNOSIS — I2510 Atherosclerotic heart disease of native coronary artery without angina pectoris: Secondary | ICD-10-CM

## 2024-08-23 MED ORDER — NITROGLYCERIN 0.4 MG SL SUBL: 0.4000 mg | SUBLINGUAL_TABLET | SUBLINGUAL | 3 refills | Status: AC | PRN

## 2024-08-23 NOTE — Patient Instructions (Signed)
 Medication Instructions:  Your physician recommends that you continue on your current medications as directed. Please refer to the Current Medication list given to you today.  *If you need a refill on your cardiac medications before your next appointment, please call your pharmacy*  Lab Work: NONE   If you have labs (blood work) drawn today and your tests are completely normal, you will receive your results only by: MyChart Message (if you have MyChart) OR A paper copy in the mail If you have any lab test that is abnormal or we need to change your treatment, we will call you to review the results.  Testing/Procedures: NONE   Follow-Up: At [facility], you and your health needs are our priority.  As part of our continuing mission to provide you with exceptional heart care, our providers are all part of one team.  This team includes your primary Cardiologist (physician) and Advanced Practice Providers or APPs (Physician Assistants and Nurse Practitioners) who all work together to provide you with the care you need, when you need it.  Your next appointment:   1 year(s)  Provider:   You may see Vinie JAYSON Maxcy, MD or one of the following Advanced Practice Providers on your designated Care Team:   Laymon Qua, PA-C  [place], [place], [place]     We recommend signing up for the patient portal called MyChart.  Sign up information is provided on this After Visit Summary.  MyChart is used to connect with patients for Virtual Visits (Telemedicine).  Patients are able to view lab/test results, encounter notes, upcoming appointments, etc.  Non-urgent messages can be sent to your provider as well.   To learn more about what you can do with MyChart, go to [email or web address].   Other Instructions Thank you for choosing [facility]!

## 2024-10-17 ENCOUNTER — Encounter: Payer: Self-pay | Admitting: Obstetrics & Gynecology

## 2024-10-17 ENCOUNTER — Ambulatory Visit: Admitting: Obstetrics & Gynecology

## 2024-10-17 VITALS — BP 146/84 | Ht 60.0 in | Wt 141.0 lb

## 2024-10-17 DIAGNOSIS — N3281 Overactive bladder: Secondary | ICD-10-CM

## 2024-10-17 DIAGNOSIS — Z4689 Encounter for fitting and adjustment of other specified devices: Secondary | ICD-10-CM | POA: Diagnosis not present

## 2024-10-17 DIAGNOSIS — N814 Uterovaginal prolapse, unspecified: Secondary | ICD-10-CM

## 2024-10-17 DIAGNOSIS — N8110 Cystocele, unspecified: Secondary | ICD-10-CM | POA: Diagnosis not present

## 2024-10-17 MED ORDER — SOLIFENACIN SUCCINATE 10 MG PO TABS: ORAL_TABLET | ORAL | 11 refills | Status: AC

## 2024-10-17 NOTE — Progress Notes (Signed)
 Chief Complaint  Patient presents with   Pessary Check    Blood pressure (!) 146/84, height 5' (1.524 m), weight 141 lb (64 kg).  Cheryl Dunn presents today for routine follow up related to her pessary.   She uses a Milex ring with support #5 She reports no vaginal discharge and little vaginal bleeding   Likert scale(1 not bothersome -5 very bothersome)  :  1  Exam reveals no undue vaginal mucosal pressure of breakdown, no discharge and no vaginal bleeding.  Vaginal Epithelial Abnormality Classification System:   0 0    No abnormalities 1    Epithelial erythema 2    Granulation tissue 3    Epithelial break or erosion, 1 cm or less 4    Epithelial break or erosion, 1 cm or greater  The pessary is removed, cleaned and replaced without difficulty.   Meds ordered this encounter  Medications   solifenacin  (VESICARE ) 10 MG tablet    Sig: 1 tablet nightly    Dispense:  30 tablet    Refill:  11         ICD-10-CM   1. Pessary maintenance, Milex ring with support #5, OF 11/2018  Z46.89     2. Cystocele with uterine prolapse  N81.4     3. OAB (overactive bladder)/detrussor instability: trial myrbetriq  50 mg qhs but too expensive, sent in vesicare  10 mg qhs  N32.81        [name] will be sen back in 3 months for continued follow up.  Vonn VEAR Inch, MD  10/17/2024 9:19 AM
